# Patient Record
Sex: Male | Born: 2000 | Race: Black or African American | Hispanic: No | Marital: Single | Smoking: Never smoker
Health system: Southern US, Community
[De-identification: ages and names within clinical notes are randomized; demographics above are authoritative.]

---
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
## 2018-03-06 NOTE — Therapy (Signed)
[facility] [facility] [address] [place], [place], [address] Phone: [phone]   Fax:  [phone]  Patient Details  Name: Mario Ellis MRN: [id number] Date of Birth: Feb 01, 2001 Referring Provider:  Evon SlackGaines, Thomas C, PA-C  Encounter Date: 09/24/2017 Encounter opened in error, patient cancelled appt  [name], [name] 03/06/2018, 4:43 PM  [place] [facility] [address] [place], [place], [address] Phone: [phone]   Fax:  [phone]

## 2019-03-16 ENCOUNTER — Ambulatory Visit
Admission: EM | Admit: 2019-03-16 | Discharge: 2019-03-16 | Disposition: A | Discharge status: Home / Self Care | Payer: Medicaid Other | Attending: Family Medicine | Admitting: Family Medicine

## 2019-03-16 ENCOUNTER — Ambulatory Visit: Payer: Medicaid Other

## 2019-03-16 DIAGNOSIS — S60052A Contusion of left little finger without damage to nail, initial encounter: Secondary | ICD-10-CM | POA: Diagnosis present

## 2019-03-16 DIAGNOSIS — S63617A Unspecified sprain of left little finger, initial encounter: Secondary | ICD-10-CM | POA: Insufficient documentation

## 2019-03-16 DIAGNOSIS — W2209XA Striking against other stationary object, initial encounter: Secondary | ICD-10-CM

## 2019-03-16 NOTE — ED Triage Notes (Signed)
Pt hit his left pinky on the countertop today and did break that same finger 2 years ago. Now having pain and swelling, can move it slightly.

## 2019-03-16 NOTE — Discharge Instructions (Addendum)
Rest. Ice.  Wear finger splint today.  Then buddy taping for the next 3 days.  Ice.  Over-the-counter ibuprofen as needed.  Follow up with your primary care physician this week as needed. Return to Urgent care for new or worsening concerns.

## 2019-03-16 NOTE — ED Provider Notes (Signed)
[facility] ____________________________________________  Time seen: Approximately 4:11 PM  I have reviewed the triage vital signs and the nursing notes.   HISTORY  Chief Complaint Finger Injury   HPI Mario Ellis is a 18 y.o. male present with mother at bedside for evaluation of left fifth digit pain post injury that occurred today.  Patient reports he accidentally hit his finger directly on top of the counter causing it to bend backwards.  Reports pain since.  States he has a previous injury to that finger 2 years ago in which he broke it.  Reports pain and swelling with difficulty falling bending finger since.  No paresthesias.  Denies other pain or injuries.  Reports otherwise doing well.  No recent sickness.  No alleviating measures attempted.    History reviewed. No pertinent past medical history.  There are no active problems to display for this patient.   Past Surgical History:  Procedure Laterality Date  . CLOSED REDUCTION FINGER WITH PERCUTANEOUS PINNING Left 07/12/2017   Procedure: CLOSED REDUCTION PROXIMAL PHALANX PERCUTANEOUS PINNING-FIFTH LEFT;  Surgeon: Kennedy BuckerMenz, Michael, MD;  Location: [facility] ORS;  Service: Orthopedics;  Laterality: Left;     No current facility-administered medications for this encounter.   Current Outpatient Medications:  .  cyclobenzaprine (FLEXERIL) 5 MG tablet, Take 1 tablet (5 mg total) 3 (three) times daily as needed by mouth for muscle spasms., Disp: 30 tablet, Rfl: 0 .  DIFFERIN 0.1 % cream, Apply 1 application topically at bedtime., Disp: , Rfl: 2 .  HYDROcodone-acetaminophen (NORCO) 5-325 MG tablet, Take 1 tablet by mouth every 6 (six) hours as needed., Disp: 20 tablet, Rfl: 0  Allergies Patient has no known allergies.  Family History  Problem Relation Age of Onset  . Hypertension Mother   . Diabetes Father     Social History Social History   Tobacco Use  . Smoking status: Never Smoker  . Smokeless tobacco:  Never Used  Substance Use Topics  . Alcohol use: Never    Frequency: Never  . Drug use: Never    Review of Systems Constitutional: No fever Cardiovascular: Denies chest pain. Respiratory: Denies shortness of breath. Gastrointestinal: No abdominal pain.   Musculoskeletal: Positive left fifth digit pain.  ____________________________________________   PHYSICAL EXAM:  VITAL SIGNS: ED Triage Vitals  Enc Vitals Group     BP 03/16/19 1528 127/65     Pulse Rate 03/16/19 1528 82     Resp 03/16/19 1528 18     Temp 03/16/19 1528 98.4 F (36.9 C)     Temp Source 03/16/19 1528 Oral     SpO2 03/16/19 1528 99 %     Weight 03/16/19 1532 (!) 322 lb (146.1 kg)     Height 03/16/19 1532 6\' 7"  (2.007 m)     Head Circumference --      Peak Flow --      Pain Score 03/16/19 1532 7     Pain Loc --      Pain Edu? --      Excl. in GC? --     Constitutional: Alert and oriented. Well appearing and in no acute distress. ENT      Head: Normocephalic and atraumatic. Cardiovascular: Normal rate, regular rhythm. Grossly normal heart sounds.  Good peripheral circulation. Respiratory: Normal respiratory effort without tachypnea nor retractions. Breath sounds are clear and equal bilaterally. No wheezes, rales, rhonchi. Musculoskeletal: Steady.  Bilateral distal radial pulses equal and easily palpated.  Bilateral hand grip strong and equal. Except:  Left fifth digit at proximal middle phalanx and proximal phalanx moderate tenderness to palpation, minimal swelling, able to fully flex as well as extend but with some pain, normal distal sensation, left hand otherwise nontender. Neurologic:  Normal speech and language. No gross focal neurologic deficits are appreciated. Speech is normal. No gait instability.  Skin:  Skin is warm, dry and intact. No rash noted. Psychiatric: Mood and affect are normal. Speech and behavior are normal. Patient exhibits appropriate insight and judgment    ___________________________________________   LABS (all labs ordered are listed, but only abnormal results are displayed)  Labs Reviewed - No data to display  RADIOLOGY  Dg Finger Little Left  Result Date: 03/16/2019 CLINICAL DATA:  Injury to fifth digit. Previous fracture. EXAM: LEFT LITTLE FINGER 2+V COMPARISON:  Plain film dated 06/15/2017. FINDINGS: Osseous alignment is normal. No acute fracture line or displaced fracture fragment. Previous fracture at the base of the fifth proximal phalanx has healed. Adjacent soft tissues are unremarkable. IMPRESSION: Negative. Electronically Signed   By: Franki Cabot M.D.   On: 03/16/2019 15:48   ____________________________________________   PROCEDURES Procedures    INITIAL IMPRESSION / ASSESSMENT AND PLAN / ED COURSE  Pertinent labs & imaging results that were available during my care of the patient were reviewed by me and considered in my medical decision making (see chart for details).  Well-appearing patient.  Left fifth digit pain post mechanical injury.  Left fifth digit x-ray as above per radiologist, reviewed, negative.  Suspect sprain contusion injury.  Finger splint given to use today, then buddy taping for the next 3 days.  Over-the-counter ibuprofen, ice and supportive care.  Discussed follow up with Primary care physician this week. Discussed follow up and return parameters including no resolution or any worsening concerns. Patient verbalized understanding and agreed to plan.   ____________________________________________   FINAL CLINICAL IMPRESSION(S) / ED DIAGNOSES  Final diagnoses:  Sprain of left little finger, unspecified site of finger, initial encounter  Contusion of left little finger without damage to nail, initial encounter     ED Discharge Orders    None       Note: This dictation was prepared with Dragon dictation along with smaller phrase technology. Any transcriptional errors that result from this  process are unintentional.         Marylene Land, NP 03/16/19 1629

## 2020-01-29 DIAGNOSIS — Z419 Encounter for procedure for purposes other than remedying health state, unspecified: Secondary | ICD-10-CM | POA: Diagnosis not present

## 2020-02-29 DIAGNOSIS — Z419 Encounter for procedure for purposes other than remedying health state, unspecified: Secondary | ICD-10-CM | POA: Diagnosis not present

## 2020-03-31 DIAGNOSIS — Z419 Encounter for procedure for purposes other than remedying health state, unspecified: Secondary | ICD-10-CM | POA: Diagnosis not present

## 2020-04-30 DIAGNOSIS — Z419 Encounter for procedure for purposes other than remedying health state, unspecified: Secondary | ICD-10-CM | POA: Diagnosis not present

## 2020-05-31 DIAGNOSIS — Z419 Encounter for procedure for purposes other than remedying health state, unspecified: Secondary | ICD-10-CM | POA: Diagnosis not present

## 2020-06-30 DIAGNOSIS — Z419 Encounter for procedure for purposes other than remedying health state, unspecified: Secondary | ICD-10-CM | POA: Diagnosis not present

## 2020-07-31 DIAGNOSIS — Z419 Encounter for procedure for purposes other than remedying health state, unspecified: Secondary | ICD-10-CM | POA: Diagnosis not present

## 2020-08-31 DIAGNOSIS — Z419 Encounter for procedure for purposes other than remedying health state, unspecified: Secondary | ICD-10-CM | POA: Diagnosis not present

## 2020-09-28 DIAGNOSIS — Z419 Encounter for procedure for purposes other than remedying health state, unspecified: Secondary | ICD-10-CM | POA: Diagnosis not present

## 2020-10-02 ENCOUNTER — Other Ambulatory Visit: Payer: Self-pay

## 2020-10-02 ENCOUNTER — Encounter: Payer: Self-pay | Admitting: Emergency Medicine

## 2020-10-02 ENCOUNTER — Ambulatory Visit
Admission: EM | Admit: 2020-10-02 | Discharge: 2020-10-02 | Disposition: A | Discharge status: Home / Self Care | Payer: No Typology Code available for payment source | Attending: Sports Medicine | Admitting: Sports Medicine

## 2020-10-02 DIAGNOSIS — L600 Ingrowing nail: Secondary | ICD-10-CM | POA: Diagnosis not present

## 2020-10-02 DIAGNOSIS — M79675 Pain in left toe(s): Secondary | ICD-10-CM

## 2020-10-02 MED ORDER — CEPHALEXIN 500 MG PO CAPS: 500.0000 mg | ORAL_CAPSULE | Freq: Four times a day (QID) | ORAL | 0 refills | Status: DC

## 2020-10-02 NOTE — Discharge Instructions (Addendum)
Please take the antibiotics as prescribed. Please look at the educational handout and do not cut your toenails too closely. Gave you a work note you can return Monday, March 7. Just supportive care with the antibiotics and over-the-counter Tylenol or Motrin for any fever or discomfort. If your symptoms worsen in any way please come back here, see your primary care physician, or go to the ER.  I hope you get to feeling better, Dr. Zachery Dauer

## 2020-10-02 NOTE — ED Triage Notes (Signed)
Patient c/o redness, swelling and pain in his left big toe that started last night.

## 2020-10-02 NOTE — ED Provider Notes (Signed)
[facility]    CSN: [id number] Arrival date & time: 10/02/20  7124      History   Chief Complaint Chief Complaint  Patient presents with  . Toe Pain    HPI Mario Ellis is a 20 y.o. male.   Patient is a 20 year old male who presents for evaluation of the above issue.  He noticed that his left great toe was starting to hurt him.  He points to the medial aspect of the great toe near the nail bed.  It appears as though he cut his nails 2 days ago he may have cut it too closely.  He has got some redness mild swelling and pain at the medial cuticle.  No fever shakes chills.  No nausea vomiting diarrhea.  No red flag signs or symptoms elicited on history.  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     History reviewed. No pertinent past medical history.  There are no problems to display for this patient.   Past Surgical History:  Procedure Laterality Date  . CLOSED REDUCTION FINGER WITH PERCUTANEOUS PINNING Left 07/12/2017   Procedure: CLOSED REDUCTION PROXIMAL PHALANX PERCUTANEOUS PINNING-FIFTH LEFT;  Surgeon: Kennedy Bucker, MD;  Location: [facility] ORS;  Service: Orthopedics;  Laterality: Left;       Home Medications    Prior to Admission medications   Medication Sig Start Date End Date Taking? Authorizing Provider  cephALEXin (KEFLEX) 500 MG capsule Take 1 capsule (500 mg total) by mouth 4 (four) times daily. 10/02/20  Yes Delton See, MD  cyclobenzaprine (FLEXERIL) 5 MG tablet Take 1 tablet (5 mg total) 3 (three) times daily as needed by mouth for muscle spasms. 06/15/17   Nita Sickle, MD  DIFFERIN 0.1 % cream Apply 1 application topically at bedtime. 06/18/17   [provider]  HYDROcodone-acetaminophen (NORCO) 5-325 MG tablet Take 1 tablet by mouth every 6 (six) hours as needed. 07/12/17   Kennedy Bucker, MD    Family History Family History  Problem Relation Age of Onset  .  Hypertension Mother   . Diabetes Father     Social History Social History   Tobacco Use  . Smoking status: Never Smoker  . Smokeless tobacco: Never Used  Vaping Use  . Vaping Use: Never used  Substance Use Topics  . Alcohol use: Never  . Drug use: Never     Allergies   Patient has no known allergies.   Review of Systems Review of Systems  Constitutional: Negative.  Negative for activity change, appetite change, chills, diaphoresis, fatigue and fever.  HENT: Negative.   Eyes: Negative.   Respiratory: Negative.   Cardiovascular: Negative.   Gastrointestinal: Negative.   Genitourinary: Negative.   Musculoskeletal: Positive for arthralgias and joint swelling.  Skin: Positive for color change.  Neurological: Negative.  Negative for dizziness, syncope, light-headedness, numbness and headaches.  All other systems reviewed and are negative.    Physical Exam Triage Vital Signs ED Triage Vitals  Enc Vitals Group     BP 10/02/20 0854 (!) 136/92     Pulse Rate 10/02/20 0854 82     Resp 10/02/20 0854 16     Temp 10/02/20 0854 98.2 F (36.8 C)     Temp Source 10/02/20 0854 Oral     SpO2 10/02/20 0854 100 %     Weight 10/02/20 0851 (!) 322 lb (146.1 kg)     Height 10/02/20  6213 6\' 5"  (1.956 m)     Head Circumference --      Peak Flow --      Pain Score 10/02/20 0850 7     Pain Loc --      Pain Edu? --      Excl. in GC? --    No data found.  Updated Vital Signs BP (!) 136/92 (BP Location: Left Arm)   Pulse 82   Temp 98.2 F (36.8 C) (Oral)   Resp 16   Ht 6\' 5"  (1.956 m)   Wt (!) 146.1 kg   SpO2 100%   BMI 38.18 kg/m   Visual Acuity Right Eye Distance:   Left Eye Distance:   Bilateral Distance:    Right Eye Near:   Left Eye Near:    Bilateral Near:     Physical Exam Vitals and nursing note reviewed.  Constitutional:      General: He is not in acute distress.    Appearance: Normal appearance. He is not ill-appearing or toxic-appearing.  HENT:      Head: Normocephalic and atraumatic.  Cardiovascular:     Rate and Rhythm: Normal rate and regular rhythm.     Pulses: Normal pulses.     Heart sounds: Normal heart sounds. No murmur heard. No friction rub. No gallop.   Pulmonary:     Effort: Pulmonary effort is normal. No respiratory distress.     Breath sounds: Normal breath sounds. No stridor. No wheezing, rhonchi or rales.  Musculoskeletal:        General: Swelling and tenderness present. No deformity.     Comments: Right great toe: Normal to inspection palpation range of motion special test.  Left great toe: He has some erythema and some mild soft tissue swelling around the cuticle mostly medially.  He is tender to palpation.  Appears as though he has an early infection of that cuticle from cutting his toenails a little too closely.  Neurovascular normal sensation 2+ pulses distally.  Skin:    General: Skin is warm.     Capillary Refill: Capillary refill takes less than 2 seconds.     Coloration: Skin is not jaundiced.     Findings: Erythema present. No bruising, lesion or rash.  Neurological:     General: No focal deficit present.     Mental Status: He is alert and oriented to person, place, and time.      UC Treatments / Results  Labs (all labs ordered are listed, but only abnormal results are displayed) Labs Reviewed - No data to display  EKG   Radiology No results found.  Procedures Procedures (including critical care time)  Medications Ordered in UC Medications - No data to display  Initial Impression / Assessment and Plan / UC Course  I have reviewed the triage vital signs and the nursing notes.  Pertinent labs & imaging results that were available during my care of the patient were reviewed by me and considered in my medical decision making (see chart for details).  Clinical impression: Left great toe pain for 2 days.  Appears as though he cut his toenail a little too closely.  He has an early infection in  that area concentrated along the medial cuticle causing some redness and swelling in the great toe.  No history of gout and there is no joint involvement.  Treatment plan: 1.  The findings and treatment plan were discussed in detail with the patient.  Patient was in agreement. 2.  We will put him on Keflex for 7 days. 3.  Educational handouts were provided.  Warm compresses and he can soak that great toe and some warm water. 4.  Over-the-counter meds as needed for any fever or discomfort. 5.  Instructed him if things were to worsen in any way he should come back here, go to his primary care physician, or go to the ER.  He voiced verbal understanding. 6.  Gave him a work note keep him out of work tomorrow but he go back on Monday. 7.  Follow-up here as needed.    Final Clinical Impressions(s) / UC Diagnoses   Final diagnoses:  Great toe pain, left  Ingrown toenail of left foot with infection     Discharge Instructions     Please take the antibiotics as prescribed. Please look at the educational handout and do not cut your toenails too closely. Gave you a work note you can return Monday, March 7. Just supportive care with the antibiotics and over-the-counter Tylenol or Motrin for any fever or discomfort. If your symptoms worsen in any way please come back here, see your primary care physician, or go to the ER.  I hope you get to feeling better, Dr. Zachery Dauer    ED Prescriptions    Medication Sig Dispense Auth. Provider   cephALEXin (KEFLEX) 500 MG capsule Take 1 capsule (500 mg total) by mouth 4 (four) times daily. 28 capsule Delton See, MD     PDMP not reviewed this encounter.   Delton See, MD 10/02/20 [phone]

## 2020-10-14 ENCOUNTER — Other Ambulatory Visit: Payer: Self-pay

## 2020-10-14 ENCOUNTER — Ambulatory Visit (INDEPENDENT_AMBULATORY_CARE_PROVIDER_SITE_OTHER)
Admit: 2020-10-14 | Discharge: 2020-10-14 | Disposition: A | Discharge status: Home / Self Care | Payer: Medicaid Other | Attending: Family Medicine | Admitting: Family Medicine

## 2020-10-14 ENCOUNTER — Ambulatory Visit
Admission: EM | Admit: 2020-10-14 | Discharge: 2020-10-14 | Disposition: A | Discharge status: Home / Self Care | Payer: Medicaid Other | Attending: Family Medicine | Admitting: Family Medicine

## 2020-10-14 DIAGNOSIS — R1030 Lower abdominal pain, unspecified: Secondary | ICD-10-CM

## 2020-10-14 DIAGNOSIS — R102 Pelvic and perineal pain: Secondary | ICD-10-CM | POA: Diagnosis not present

## 2020-10-14 DIAGNOSIS — R109 Unspecified abdominal pain: Secondary | ICD-10-CM | POA: Diagnosis not present

## 2020-10-14 DIAGNOSIS — K5900 Constipation, unspecified: Secondary | ICD-10-CM

## 2020-10-14 DIAGNOSIS — M5386 Other specified dorsopathies, lumbar region: Secondary | ICD-10-CM | POA: Diagnosis not present

## 2020-10-14 LAB — COMPREHENSIVE METABOLIC PANEL
ALT: 29 U/L (ref 0–44)
AST: 24 U/L (ref 15–41)
Albumin: 4.5 g/dL (ref 3.5–5.0)
Alkaline Phosphatase: 57 U/L (ref 38–126)
Anion gap: 12 (ref 5–15)
BUN: 9 mg/dL (ref 6–20)
CO2: 25 mmol/L (ref 22–32)
Calcium: 9.8 mg/dL (ref 8.9–10.3)
Chloride: 104 mmol/L (ref 98–111)
Creatinine, Ser: 0.87 mg/dL (ref 0.61–1.24)
GFR, Estimated: >60 mL/min (ref >60)
Glucose, Bld: 96 mg/dL (ref 70–99)
Potassium: 3.9 mmol/L (ref 3.5–5.1)
Sodium: 141 mmol/L (ref 135–145)
Total Bilirubin: 1.4 mg/dL — ABNORMAL HIGH (ref 0.3–1.2)
Total Protein: 8 g/dL (ref 6.5–8.1)

## 2020-10-14 LAB — URINALYSIS, COMPLETE (UACMP) WITH MICROSCOPIC
Bilirubin Urine: NEGATIVE
Glucose, UA: NEGATIVE mg/dL
Hgb urine dipstick: NEGATIVE
Ketones, ur: NEGATIVE mg/dL
Leukocytes,Ua: NEGATIVE
Nitrite: NEGATIVE
Protein, ur: NEGATIVE mg/dL
RBC / HPF: NONE SEEN RBC/hpf (ref 0–5)
Specific Gravity, Urine: >1.03 — ABNORMAL HIGH (ref 1.005–1.030)
pH: 5 (ref 5.0–8.0)

## 2020-10-14 LAB — CBC WITH DIFFERENTIAL/PLATELET
Abs Immature Granulocytes: 0.01 10*3/uL (ref 0.00–0.07)
Basophils Absolute: 0 10*3/uL (ref 0.0–0.1)
Basophils Relative: 0 %
Eosinophils Absolute: 0.4 10*3/uL (ref 0.0–0.5)
Eosinophils Relative: 7 %
HCT: 41.2 % (ref 39.0–52.0)
Hemoglobin: 13.5 g/dL (ref 13.0–17.0)
Immature Granulocytes: 0 %
Lymphocytes Relative: 44 %
Lymphs Abs: 2.2 10*3/uL (ref 0.7–4.0)
MCH: 28 pg (ref 26.0–34.0)
MCHC: 32.8 g/dL (ref 30.0–36.0)
MCV: 85.5 fL (ref 80.0–100.0)
Monocytes Absolute: 0.5 10*3/uL (ref 0.1–1.0)
Monocytes Relative: 9 %
Neutro Abs: 2 10*3/uL (ref 1.7–7.7)
Neutrophils Relative %: 40 %
Platelets: 256 10*3/uL (ref 150–400)
RBC: 4.82 MIL/uL (ref 4.22–5.81)
RDW: 12 % (ref 11.5–15.5)
WBC: 5.1 10*3/uL (ref 4.0–10.5)
nRBC: 0 % (ref 0.0–0.2)

## 2020-10-14 MED ORDER — POLYETHYLENE GLYCOL 3350 17 GM/SCOOP PO POWD
ORAL | 0 refills | Status: DC
Start: 2020-10-14 — End: 2021-07-12

## 2020-10-14 MED ORDER — KETOROLAC TROMETHAMINE 10 MG PO TABS: 10.0000 mg | ORAL_TABLET | Freq: Four times a day (QID) | ORAL | 0 refills | Status: DC | PRN

## 2020-10-14 NOTE — ED Triage Notes (Signed)
Pt c/o LRQ and pelvic pain for several weeks, pt reports pain is increasing. Pt works on his feet all day and this also makes the pain worse. Pt denies any urinary/bowel, f/n/v/d or other symptoms. Pt does report some testicular pain on the right side but that has improved.

## 2020-10-14 NOTE — Discharge Instructions (Signed)
Medications as prescribed. ° °Take care ° °Dr. Prabhnoor Ellenberger  °

## 2020-10-14 NOTE — ED Provider Notes (Signed)
[facility]    CSN: [id number] Arrival date & time: 10/14/20  1224      History   Chief Complaint Chief Complaint  Patient presents with  . Abdominal Pain    LRQ   HPI 20 year old male presents with the above complaint.  Patient reports a 3-week history of right flank pain, right lower quadrant pain.  Denies any fever.  Denies nausea, vomiting, diarrhea.  States that he had some initial testicular pain but this is resolved.  He states that his pain seems to be worsening.  It is now constant and is quite severe.  He currently rates his pain is 8/10 in severity.  He states that it is worse with activity.  It is very sharp.  Denies urinary symptoms.  No relieving factors.  No other complaints or concerns at this time.   Past Surgical History:  Procedure Laterality Date  . CLOSED REDUCTION FINGER WITH PERCUTANEOUS PINNING Left 07/12/2017   Procedure: CLOSED REDUCTION PROXIMAL PHALANX PERCUTANEOUS PINNING-FIFTH LEFT;  Surgeon: Kennedy Bucker, MD;  Location: [facility] ORS;  Service: Orthopedics;  Laterality: Left;       Home Medications    Prior to Admission medications   Medication Sig Start Date End Date Taking? Authorizing Provider  ketorolac (TORADOL) 10 MG tablet Take 1 tablet (10 mg total) by mouth every 6 (six) hours as needed for moderate pain or severe pain. 10/14/20  Yes Jahaziel Francois G, DO  polyethylene glycol powder (GLYCOLAX/MIRALAX) 17 GM/SCOOP powder 17 g once or twice daily for constipation. 10/14/20  Yes Sabrin Dunlevy G, DO  cephALEXin (KEFLEX) 500 MG capsule Take 1 capsule (500 mg total) by mouth 4 (four) times daily. 10/02/20   Delton See, MD  cyclobenzaprine (FLEXERIL) 5 MG tablet Take 1 tablet (5 mg total) 3 (three) times daily as needed by mouth for muscle spasms. 06/15/17   Nita Sickle, MD  DIFFERIN 0.1 % cream Apply 1 application topically at bedtime. 06/18/17   [provider]  HYDROcodone-acetaminophen (NORCO) 5-325 MG tablet Take 1  tablet by mouth every 6 (six) hours as needed. 07/12/17   Kennedy Bucker, MD    Family History Family History  Problem Relation Age of Onset  . Hypertension Mother   . Diabetes Father     Social History Social History   Tobacco Use  . Smoking status: Never Smoker  . Smokeless tobacco: Never Used  Vaping Use  . Vaping Use: Never used  Substance Use Topics  . Alcohol use: Never  . Drug use: Never     Allergies   Patient has no known allergies.   Review of Systems Review of Systems  Constitutional: Negative for fever.  Gastrointestinal: Positive for abdominal pain.  Genitourinary: Positive for flank pain.   Physical Exam Triage Vital Signs ED Triage Vitals  Enc Vitals Group     BP 10/14/20 1001 (!) 143/76     Pulse Rate 10/14/20 1001 89     Resp 10/14/20 1001 18     Temp 10/14/20 1001 98.4 F (36.9 C)     Temp Source 10/14/20 1001 Oral     SpO2 10/14/20 1001 98 %     Weight 10/14/20 0959 (!) 322 lb (146.1 kg)     Height 10/14/20 0959 6\' 5"  (1.956 m)     Head Circumference --      Peak Flow --      Pain Score 10/14/20 0959 8     Pain Loc --  Pain Edu? --      Excl. in GC? --    Updated Vital Signs BP (!) 143/76 (BP Location: Left Arm)   Pulse 89   Temp 98.4 F (36.9 C) (Oral)   Resp 18   Ht 6\' 5"  (1.956 m)   Wt (!) 146.1 kg   SpO2 98%   BMI 38.18 kg/m   Visual Acuity Right Eye Distance:   Left Eye Distance:   Bilateral Distance:    Right Eye Near:   Left Eye Near:    Bilateral Near:     Physical Exam Vitals and nursing note reviewed.  Constitutional:      General: He is not in acute distress.    Appearance: He is well-developed. He is not ill-appearing.  HENT:     Head: Normocephalic and atraumatic.  Eyes:     General:        Right eye: No discharge.        Left eye: No discharge.     Conjunctiva/sclera: Conjunctivae normal.  Cardiovascular:     Rate and Rhythm: Normal rate and regular rhythm.  Pulmonary:     Effort: Pulmonary  effort is normal.     Breath sounds: Normal breath sounds. No wheezing, rhonchi or rales.  Abdominal:     General: There is no distension.     Palpations: Abdomen is soft.     Comments: Mild RLQ tenderness to palpation.  Neurological:     Mental Status: He is alert.  Psychiatric:        Mood and Affect: Mood normal.        Behavior: Behavior normal.    UC Treatments / Results  Labs (all labs ordered are listed, but only abnormal results are displayed) Labs Reviewed  COMPREHENSIVE METABOLIC PANEL - Abnormal; Notable for the following components:      Result Value   Total Bilirubin 1.4 (*)    All other components within normal limits  URINALYSIS, COMPLETE (UACMP) WITH MICROSCOPIC - Abnormal; Notable for the following components:   Specific Gravity, Urine >1.030 (*)    Bacteria, UA FEW (*)    All other components within normal limits  CBC WITH DIFFERENTIAL/PLATELET    EKG   Radiology CT ABDOMEN PELVIS WO CONTRAST  Result Date: 10/14/2020 CLINICAL DATA:  Flank pain.  Pelvic pain for several weeks. EXAM: CT ABDOMEN AND PELVIS WITHOUT CONTRAST TECHNIQUE: Multidetector CT imaging of the abdomen and pelvis was performed following the standard protocol without IV contrast. COMPARISON:  None. FINDINGS: Lower chest: Clear lung bases. Normal heart size without pericardial or pleural effusion. Hepatobiliary: Prominent lateral segment left liver lobe. Normal gallbladder, without biliary ductal dilatation. Pancreas: Normal, without mass or ductal dilatation. Spleen: Normal in size, without focal abnormality. Adrenals/Urinary Tract: Normal adrenal glands. No renal calculi or hydronephrosis. No hydroureter or ureteric calculi. No bladder calculi. Stomach/Bowel: Normal stomach, without wall thickening. Colonic stool burden suggests constipation. Normal appendix, and terminal ileum. Normal small bowel. Vascular/Lymphatic: Normal caliber of the aorta and branch vessels. No abdominopelvic adenopathy.  Reproductive: Normal prostate. Other: No significant free fluid.  No free intraperitoneal air. Musculoskeletal: No acute osseous abnormality. Minimal convex left lumbar spine curvature. IMPRESSION: 1. No urinary tract calculi or hydronephrosis. 2. Possible constipation. Electronically Signed   By: 10/16/2020 M.D.   On: 10/14/2020 11:48    Procedures Procedures (including critical care time)  Medications Ordered in UC Medications - No data to display  Initial Impression / Assessment and Plan / UC  Course  I have reviewed the triage vital signs and the nursing notes.  Pertinent labs & imaging results that were available during my care of the patient were reviewed by me and considered in my medical decision making (see chart for details).    20 year old male presents with ongoing right flank pain and right lower quadrant pain.  Tender on exam.  Laboratory studies unremarkable.  No leukocytosis.  No hematuria or evidence of UTI.  CT scan was obtained given ongoing and severe pain.  CT did not reveal evidence of stone.  No acute process noted.  He is constipated.  Toradol as needed.  MiraLAX as directed.  Supportive care.  Final Clinical Impressions(s) / UC Diagnoses   Final diagnoses:  Constipation, unspecified constipation type  Flank pain     Discharge Instructions     Medications as prescribed.  Take care  Dr. Adriana Simas     ED Prescriptions    Medication Sig Dispense Auth. Provider   ketorolac (TORADOL) 10 MG tablet Take 1 tablet (10 mg total) by mouth every 6 (six) hours as needed for moderate pain or severe pain. 20 tablet Damonie Ellenwood G, DO   polyethylene glycol powder (GLYCOLAX/MIRALAX) 17 GM/SCOOP powder 17 g once or twice daily for constipation. 500 g Tommie Sams, DO     PDMP not reviewed this encounter.   Tommie Sams, [place] 10/14/20 1218

## 2020-10-26 DIAGNOSIS — K5900 Constipation, unspecified: Secondary | ICD-10-CM | POA: Diagnosis not present

## 2020-10-26 DIAGNOSIS — R1084 Generalized abdominal pain: Secondary | ICD-10-CM | POA: Diagnosis not present

## 2020-10-29 DIAGNOSIS — Z419 Encounter for procedure for purposes other than remedying health state, unspecified: Secondary | ICD-10-CM | POA: Diagnosis not present

## 2020-11-14 ENCOUNTER — Encounter: Payer: Self-pay | Admitting: Emergency Medicine

## 2020-11-14 ENCOUNTER — Ambulatory Visit
Admission: EM | Admit: 2020-11-14 | Discharge: 2020-11-14 | Disposition: A | Discharge status: Home / Self Care | Payer: Medicaid Other | Attending: Physician Assistant | Admitting: Physician Assistant

## 2020-11-14 ENCOUNTER — Other Ambulatory Visit: Payer: Self-pay

## 2020-11-14 DIAGNOSIS — L03031 Cellulitis of right toe: Secondary | ICD-10-CM

## 2020-11-14 MED ORDER — DOXYCYCLINE HYCLATE 100 MG PO CAPS: 100.0000 mg | ORAL_CAPSULE | Freq: Two times a day (BID) | ORAL | 0 refills | Status: AC

## 2020-11-14 MED ORDER — MUPIROCIN 2 % EX OINT: 1.0000 "application " | TOPICAL_OINTMENT | Freq: Three times a day (TID) | CUTANEOUS | 0 refills | Status: AC

## 2020-11-14 NOTE — ED Triage Notes (Signed)
Patient states that he woke up 2 days ago and his right big toe was swollen, painful and red.  Patient denies any injury or fall.

## 2020-11-14 NOTE — ED Provider Notes (Signed)
[facility]    CSN: [id number] Arrival date & time: 11/14/20  2229      History   Chief Complaint Chief Complaint  Patient presents with  . Toe Pain    HPI Mario Ellis is a 20 y.o. male presenting for pain of the right great toe.  Patient states it started hurting about 2 days ago.  He denies any sort of injury or trauma to the foot/toe.  He says that the area is red and there has been pustular drainage. He has tried to keep area clean, but not applied any topical ointments. Has not taken anything for pain relief. He says he took off work yesterday because it hurt too badly to walk. No other concerns.   HPI  History reviewed. No pertinent past medical history.  There are no problems to display for this patient.   Past Surgical History:  Procedure Laterality Date  . CLOSED REDUCTION FINGER WITH PERCUTANEOUS PINNING Left 07/12/2017   Procedure: CLOSED REDUCTION PROXIMAL PHALANX PERCUTANEOUS PINNING-FIFTH LEFT;  Surgeon: Kennedy Bucker, MD;  Location: [facility] ORS;  Service: Orthopedics;  Laterality: Left;       Home Medications    Prior to Admission medications   Medication Sig Start Date End Date Taking? Authorizing Provider  doxycycline (VIBRAMYCIN) 100 MG capsule Take 1 capsule (100 mg total) by mouth 2 (two) times daily for 7 days. 11/14/20 11/21/20 Yes Shirlee Latch, PA-C  mupirocin ointment (BACTROBAN) 2 % Apply 1 application topically 3 (three) times daily for 7 days. 11/14/20 11/21/20 Yes Shirlee Latch, PA-C  cyclobenzaprine (FLEXERIL) 5 MG tablet Take 1 tablet (5 mg total) 3 (three) times daily as needed by mouth for muscle spasms. 06/15/17   Nita Sickle, MD  DIFFERIN 0.1 % cream Apply 1 application topically at bedtime. 06/18/17   [provider]  HYDROcodone-acetaminophen (NORCO) 5-325 MG tablet Take 1 tablet by mouth every 6 (six) hours as needed. 07/12/17   Kennedy Bucker, MD  ketorolac (TORADOL) 10 MG tablet Take 1 tablet (10 mg  total) by mouth every 6 (six) hours as needed for moderate pain or severe pain. 10/14/20   Tommie Sams, DO  polyethylene glycol powder (GLYCOLAX/MIRALAX) 17 GM/SCOOP powder 17 g once or twice daily for constipation. 10/14/20   Tommie Sams, DO    Family History Family History  Problem Relation Age of Onset  . Hypertension Mother   . Diabetes Father     Social History Social History   Tobacco Use  . Smoking status: Never Smoker  . Smokeless tobacco: Never Used  Vaping Use  . Vaping Use: Never used  Substance Use Topics  . Alcohol use: Never  . Drug use: Never     Allergies   Patient has no known allergies.   Review of Systems Review of Systems  Constitutional: Negative for fever.  Musculoskeletal: Negative for arthralgias, gait problem and joint swelling.  Skin: Positive for color change. Negative for rash and wound.  Neurological: Negative for weakness.     Physical Exam Triage Vital Signs ED Triage Vitals  Enc Vitals Group     BP 11/14/20 0840 132/88     Pulse Rate 11/14/20 0840 78     Resp 11/14/20 0840 16     Temp 11/14/20 0840 98.4 F (36.9 C)     Temp Source 11/14/20 0840 Oral     SpO2 11/14/20 0840 98 %     Weight 11/14/20 0839 (!) 322 lb (146.1 kg)  Height 11/14/20 0839 6\' 5"  (1.956 m)     Head Circumference --      Peak Flow --      Pain Score 11/14/20 0838 7     Pain Loc --      Pain Edu? --      Excl. in GC? --    No data found.  Updated Vital Signs BP 132/88 (BP Location: Left Arm)   Pulse 78   Temp 98.4 F (36.9 C) (Oral)   Resp 16   Ht 6\' 5"  (1.956 m)   Wt (!) 322 lb (146.1 kg)   SpO2 98%   BMI 38.18 kg/m       Physical Exam Vitals and nursing note reviewed.  Constitutional:      General: He is not in acute distress.    Appearance: Normal appearance. He is well-developed. He is not ill-appearing.  HENT:     Head: Normocephalic and atraumatic.  Eyes:     General: No scleral icterus.    Conjunctiva/sclera: Conjunctivae  normal.  Cardiovascular:     Rate and Rhythm: Normal rate and regular rhythm.     Pulses: Normal pulses.  Pulmonary:     Effort: Pulmonary effort is normal. No respiratory distress.  Musculoskeletal:     Cervical back: Neck supple.  Skin:    General: Skin is warm and dry.     Comments: RIGHT GREAT TOE: There is mild/moderate swelling/erythema lateral nailfold with associated TTP. Small amount of pustular drainage. Good ROM of toe. Normal strength and sensation.  Neurological:     General: No focal deficit present.     Mental Status: He is alert. Mental status is at baseline.     Motor: No weakness.     Gait: Gait normal.  Psychiatric:        Mood and Affect: Mood normal.        Behavior: Behavior normal.        Thought Content: Thought content normal.      UC Treatments / Results  Labs (all labs ordered are listed, but only abnormal results are displayed) Labs Reviewed - No data to display  EKG   Radiology No results found.  Procedures Procedures (including critical care time)  Medications Ordered in UC Medications - No data to display  Initial Impression / Assessment and Plan / UC Course  I have reviewed the triage vital signs and the nursing notes.  Pertinent labs & imaging results that were available during my care of the patient were reviewed by me and considered in my medical decision making (see chart for details).   20 y/o male presenting with paronychia/cellulitis right great toe with active pustular drainage. Treating at this time with doxycycline, mupirocin and supportive care. Tylenol/Motrin for pain relief. F/u with or PCP if not improving over next week. ED precautions reviewed.   Final Clinical Impressions(s) / UC Diagnoses   Final diagnoses:  Paronychia, toe, right     Discharge Instructions     You have a paronychia, a type of soft tissue infection that does require an antibiotic so we will treat with antibiotics. Take NSAIDs or Tylenol for  pain as needed. Take doxycycline and use mupirocin ointment. Warm soaks 2 times daily. Lookout for spreading redness, fluctuance, fever, drainage from the area, or increased pain. Follow up in 2-3 days with PCP or our office sooner if condition is not improving or if especially worsening.     ED Prescriptions    Medication  Sig Dispense Auth. Provider   doxycycline (VIBRAMYCIN) 100 MG capsule Take 1 capsule (100 mg total) by mouth 2 (two) times daily for 7 days. 14 capsule Eusebio Friendly B, PA-C   mupirocin ointment (BACTROBAN) 2 % Apply 1 application topically 3 (three) times daily for 7 days. 22 g Shirlee Latch, PA-C     PDMP not reviewed this encounter.   Shirlee Latch, PA-C 11/14/20 [phone]

## 2020-11-14 NOTE — Discharge Instructions (Signed)
You have a paronychia, a type of soft tissue infection that does require an antibiotic so we will treat with antibiotics. Take NSAIDs or Tylenol for pain as needed. Take doxycycline and use mupirocin ointment. Warm soaks 2 times daily. Lookout for spreading redness, fluctuance, fever, drainage from the area, or increased pain. Follow up in 2-3 days with PCP or our office sooner if condition is not improving or if especially worsening.

## 2020-11-28 DIAGNOSIS — Z419 Encounter for procedure for purposes other than remedying health state, unspecified: Secondary | ICD-10-CM | POA: Diagnosis not present

## 2020-12-29 DIAGNOSIS — Z419 Encounter for procedure for purposes other than remedying health state, unspecified: Secondary | ICD-10-CM | POA: Diagnosis not present

## 2021-01-27 ENCOUNTER — Other Ambulatory Visit: Payer: Self-pay

## 2021-01-27 ENCOUNTER — Ambulatory Visit
Admission: EM | Admit: 2021-01-27 | Discharge: 2021-01-27 | Disposition: A | Discharge status: Home / Self Care | Payer: Medicaid Other | Attending: Sports Medicine | Admitting: Sports Medicine

## 2021-01-27 ENCOUNTER — Encounter: Payer: Self-pay | Admitting: Emergency Medicine

## 2021-01-27 DIAGNOSIS — S8001XA Contusion of right knee, initial encounter: Secondary | ICD-10-CM | POA: Diagnosis not present

## 2021-01-27 DIAGNOSIS — S5012XA Contusion of left forearm, initial encounter: Secondary | ICD-10-CM

## 2021-01-27 DIAGNOSIS — M25561 Pain in right knee: Secondary | ICD-10-CM

## 2021-01-27 DIAGNOSIS — S301XXA Contusion of abdominal wall, initial encounter: Secondary | ICD-10-CM
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
## 2021-01-28 DIAGNOSIS — Z419 Encounter for procedure for purposes other than remedying health state, unspecified: Secondary | ICD-10-CM | POA: Diagnosis not present

## 2021-02-02 ENCOUNTER — Other Ambulatory Visit: Payer: Self-pay | Admitting: Pediatrics

## 2021-02-02 ENCOUNTER — Ambulatory Visit: Admission: RE | Admit: 2021-02-02 | Payer: Medicaid Other | Source: Home / Self Care

## 2021-02-02 DIAGNOSIS — R1031 Right lower quadrant pain: Secondary | ICD-10-CM | POA: Diagnosis not present

## 2021-02-02 DIAGNOSIS — S301XXA Contusion of abdominal wall, initial encounter: Secondary | ICD-10-CM

## 2021-02-03 ENCOUNTER — Other Ambulatory Visit: Payer: Self-pay

## 2021-02-03 ENCOUNTER — Ambulatory Visit
Admission: RE | Admit: 2021-02-03 | Discharge: 2021-02-03 | Disposition: A | Discharge status: Home / Self Care | Payer: Medicaid Other | Source: Ambulatory Visit | Attending: Pediatrics | Admitting: Pediatrics

## 2021-02-03 DIAGNOSIS — S301XXA Contusion of abdominal wall, initial encounter: Secondary | ICD-10-CM | POA: Insufficient documentation

## 2021-02-18 ENCOUNTER — Ambulatory Visit: Payer: Medicaid Other | Admitting: Family Medicine

## 2021-02-28 DIAGNOSIS — Z419 Encounter for procedure for purposes other than remedying health state, unspecified: Secondary | ICD-10-CM | POA: Diagnosis not present

## 2021-03-31 DIAGNOSIS — Z419 Encounter for procedure for purposes other than remedying health state, unspecified: Secondary | ICD-10-CM | POA: Diagnosis not present

## 2021-04-30 DIAGNOSIS — Z419 Encounter for procedure for purposes other than remedying health state, unspecified: Secondary | ICD-10-CM | POA: Diagnosis not present

## 2021-05-31 DIAGNOSIS — Z419 Encounter for procedure for purposes other than remedying health state, unspecified: Secondary | ICD-10-CM | POA: Diagnosis not present

## 2021-06-30 DIAGNOSIS — Z419 Encounter for procedure for purposes other than remedying health state, unspecified: Secondary | ICD-10-CM | POA: Diagnosis not present

## 2021-07-12 ENCOUNTER — Other Ambulatory Visit: Payer: Self-pay

## 2021-07-12 ENCOUNTER — Ambulatory Visit
Admission: EM | Admit: 2021-07-12 | Discharge: 2021-07-12 | Disposition: A | Discharge status: Home / Self Care | Payer: Medicaid Other | Attending: Family | Admitting: Family

## 2021-07-12 DIAGNOSIS — R52 Pain, unspecified: Secondary | ICD-10-CM

## 2021-07-12 DIAGNOSIS — U071 COVID-19: Secondary | ICD-10-CM | POA: Insufficient documentation

## 2021-07-12 DIAGNOSIS — R051 Acute cough: Secondary | ICD-10-CM | POA: Insufficient documentation

## 2021-07-12 NOTE — Discharge Instructions (Addendum)
Recommend continue OTC Mucinex and Nyquil as needed for cough. Continue to push fluids to help loosen up mucus in chest. Rest. Follow-up pending COVID 19 test results.

## 2021-07-12 NOTE — ED Provider Notes (Signed)
[facility]    CSN: [id number] Arrival date & time: 07/12/21  1751      History   Chief Complaint Chief Complaint  Patient presents with   Cough    HPI Mario Ellis is a 20 y.o. male.   20 year old male presents sinus pressure, cough and congestion that has been occurring for over 1 week. Started with low grade fever, sore throat and body aches. Also experiencing some diarrhea. Cough and congestion has continued but improved. Denies any vomiting or headache now. Fever and sore throat are gone. Has taken OTC Mucinex, Cold and Flu and Nyquil with some success. Requests testing for COVID. His Dad is concerned about pneumonia. No other chronic health issues. Takes no daily medication.   The history is provided by the patient.   History reviewed. No pertinent past medical history.  There are no problems to display for this patient.   Past Surgical History:  Procedure Laterality Date   CLOSED REDUCTION FINGER WITH PERCUTANEOUS PINNING Left 07/12/2017   Procedure: CLOSED REDUCTION PROXIMAL PHALANX PERCUTANEOUS PINNING-FIFTH LEFT;  Surgeon: Kennedy Bucker, MD;  Location: [facility] ORS;  Service: Orthopedics;  Laterality: Left;       Home Medications    Prior to Admission medications   Not on File    Family History Family History  Problem Relation Age of Onset   Hypertension Mother    Diabetes Father     Social History Social History   Tobacco Use   Smoking status: Never   Smokeless tobacco: Never  Vaping Use   Vaping Use: Never used  Substance Use Topics   Alcohol use: Never   Drug use: Never     Allergies   Patient has no known allergies.   Review of Systems Review of Systems  Constitutional:  Positive for appetite change, fatigue and fever (at beginning of illness- none currently). Negative for chills.  HENT:  Positive for congestion, postnasal drip, sinus pressure and sore throat (at first but has resolved). Negative for ear discharge,  ear pain, facial swelling, nosebleeds, sinus pain and trouble swallowing.   Eyes:  Negative for pain, discharge, redness and itching.  Respiratory:  Positive for cough and chest tightness. Negative for shortness of breath and wheezing.   Gastrointestinal:  Positive for diarrhea. Negative for nausea and vomiting.  Musculoskeletal:  Positive for arthralgias and myalgias. Negative for neck pain and neck stiffness.  Skin:  Negative for color change and rash.  Allergic/Immunologic: Negative for environmental allergies, food allergies and immunocompromised state.  Neurological:  Positive for headaches. Negative for dizziness, tremors, seizures, syncope and light-headedness.  Hematological:  Negative for adenopathy. Does not bruise/bleed easily.    Physical Exam Triage Vital Signs ED Triage Vitals  Enc Vitals Group     BP 07/12/21 1913 127/86     Pulse Rate 07/12/21 1913 70     Resp 07/12/21 1913 18     Temp 07/12/21 1913 99 F (37.2 C)     Temp Source 07/12/21 1913 Oral     SpO2 07/12/21 1913 99 %     Weight 07/12/21 1912 (!) 320 lb (145.2 kg)     Height 07/12/21 1912 6\' 8"  (2.032 m)     Head Circumference --      Peak Flow --      Pain Score 07/12/21 1912 0     Pain Loc --      Pain Edu? --      Excl. in GC? --  No data found.  Updated Vital Signs BP 127/86 (BP Location: Left Arm)    Pulse 70    Temp 99 F (37.2 C) (Oral)    Resp 18    Ht 6\' 8"  (2.032 m)    Wt (!) 320 lb (145.2 kg)    SpO2 99%    BMI 35.15 kg/m   Visual Acuity Right Eye Distance:   Left Eye Distance:   Bilateral Distance:    Right Eye Near:   Left Eye Near:    Bilateral Near:     Physical Exam Vitals and nursing note reviewed.  Constitutional:      General: He is awake. He is not in acute distress.    Appearance: He is well-developed and well-groomed.     Comments: He is sitting comfortably on the exam table in no acute distress.   HENT:     Head: Normocephalic and atraumatic.     Right Ear:  Hearing, tympanic membrane, ear canal and external ear normal.     Left Ear: Hearing, tympanic membrane, ear canal and external ear normal.     Nose: Nose normal. No congestion or rhinorrhea.     Right Sinus: No maxillary sinus tenderness or frontal sinus tenderness.     Left Sinus: No maxillary sinus tenderness or frontal sinus tenderness.     Mouth/Throat:     Lips: Pink.     Mouth: Mucous membranes are moist.     Pharynx: Uvula midline. Posterior oropharyngeal erythema present. No pharyngeal swelling, oropharyngeal exudate or uvula swelling.  Eyes:     Extraocular Movements: Extraocular movements intact.     Conjunctiva/sclera: Conjunctivae normal.  Cardiovascular:     Rate and Rhythm: Normal rate and regular rhythm.     Heart sounds: Normal heart sounds. No murmur heard. Pulmonary:     Effort: Pulmonary effort is normal. No respiratory distress.     Breath sounds: Normal air entry. No decreased air movement. Examination of the right-upper field reveals rhonchi. Examination of the left-upper field reveals rhonchi. Rhonchi present. No decreased breath sounds, wheezing or rales.     Comments: Coarse breath sounds when coughing but otherwise clear Musculoskeletal:     Cervical back: Normal range of motion and neck supple.  Lymphadenopathy:     Cervical: No cervical adenopathy.  Skin:    General: Skin is warm and dry.     Capillary Refill: Capillary refill takes less than 2 seconds.     Findings: No rash.  Neurological:     General: No focal deficit present.     Mental Status: He is alert and oriented to person, place, and time.  Psychiatric:        Mood and Affect: Mood normal.        Behavior: Behavior normal. Behavior is cooperative.        Thought Content: Thought content normal.        Judgment: Judgment normal.     UC Treatments / Results  Labs (all labs ordered are listed, but only abnormal results are displayed) Labs Reviewed  SARS CORONAVIRUS 2 (TAT 6-24 HRS)     EKG   Radiology No results found.  Procedures Procedures (including critical care time)  Medications Ordered in UC Medications - No data to display  Initial Impression / Assessment and Plan / UC Course  I have reviewed the triage vital signs and the nursing notes.  Pertinent labs & imaging results that were available during my care of the patient were  reviewed by me and considered in my medical decision making (see chart for details).     Reviewed with patient that he appears to have a viral upper respiratory illness- possibly Influenza but will test for COVID 19. Discussed that since he has had symptoms for over 1 week and they are improving and cough is slowly getting better, doubt pneumonia and symptoms should continue to slowly resolve. Symptomatic treatment at this time. Recommend continue OTC Mucinex and Nyquil as needed for cough. Continue to push fluids to help loosen up mucus in chest. Rest. Follow-up pending COVID 19 test results.    Final Clinical Impressions(s) / UC Diagnoses   Final diagnoses:  Acute cough  Generalized body aches     Discharge Instructions      Recommend continue OTC Mucinex and Nyquil as needed for cough. Continue to push fluids to help loosen up mucus in chest. Rest. Follow-up pending COVID 19 test results.     ED Prescriptions   None    PDMP not reviewed this encounter.   Sudie Grumbling, NP 07/13/21 [phone]

## 2021-07-12 NOTE — ED Triage Notes (Signed)
Pt here with C/O sore throat, body aches, cough for 1 week. Denies fever or known exposure.

## 2021-07-13 LAB — SARS CORONAVIRUS 2 (TAT 6-24 HRS): SARS Coronavirus 2: POSITIVE — AB

## 2021-07-24 IMAGING — CT CT ABD-PELV W/O CM
1 of 2 series · 15 of 32 positions shown, 19 images · non-contrast
Comparison: None.

CLINICAL DATA: Flank pain.  Pelvic pain for several weeks.

EXAM:
CT ABDOMEN AND PELVIS WITHOUT CONTRAST
TECHNIQUE: Multidetector CT imaging of the abdomen and pelvis was performed
following the standard protocol without IV contrast.

[Series 2: axial st · axial · 0.78mm/px · z∈[-1109,-544]mm · 15 of 125 slices shown, 19 images]
[im 6/125  soft-tissue]
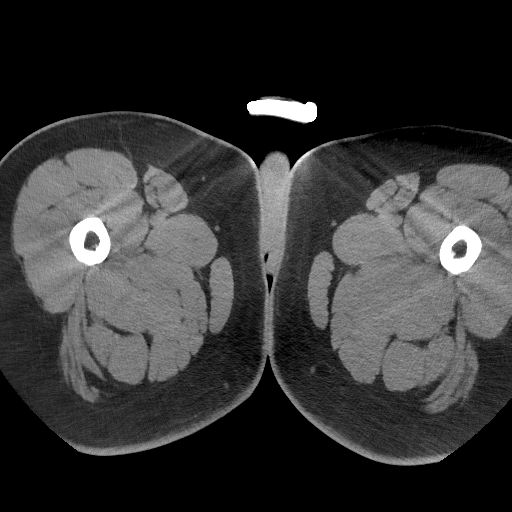
[im 6/125  bone]
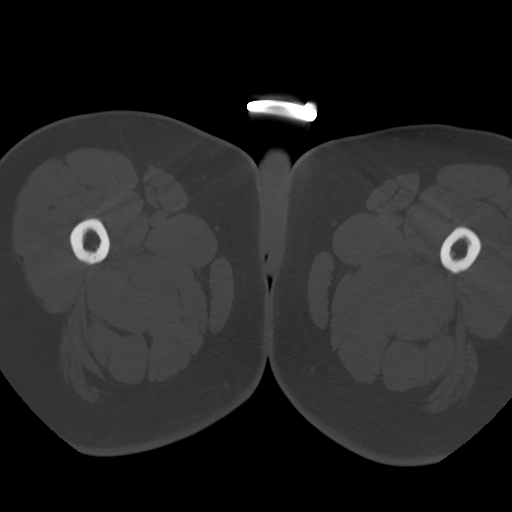
[im 17/125  soft-tissue]
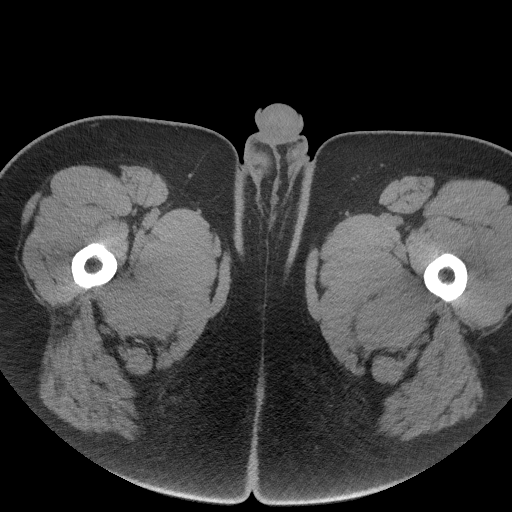
[im 27/125  soft-tissue]
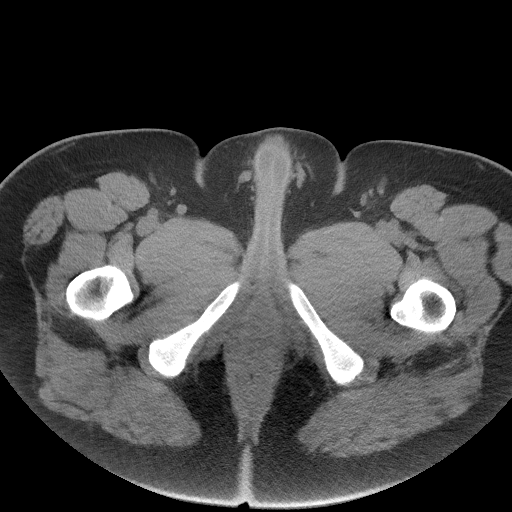
[im 33/125  soft-tissue]
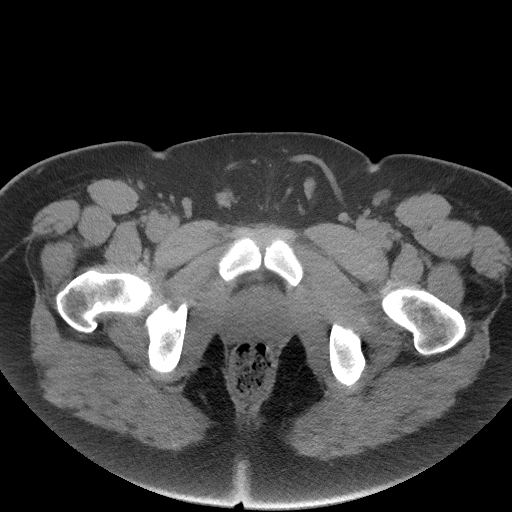
[im 44/125  soft-tissue]
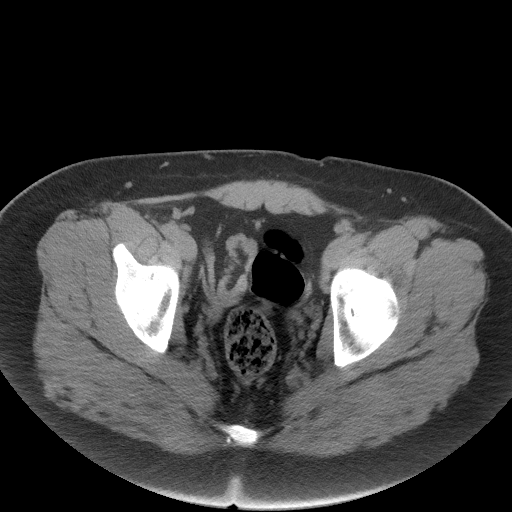
[im 54/125  soft-tissue]
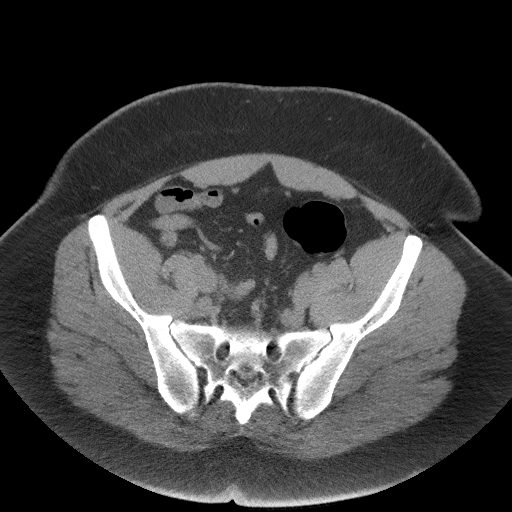
[im 65/125  soft-tissue]
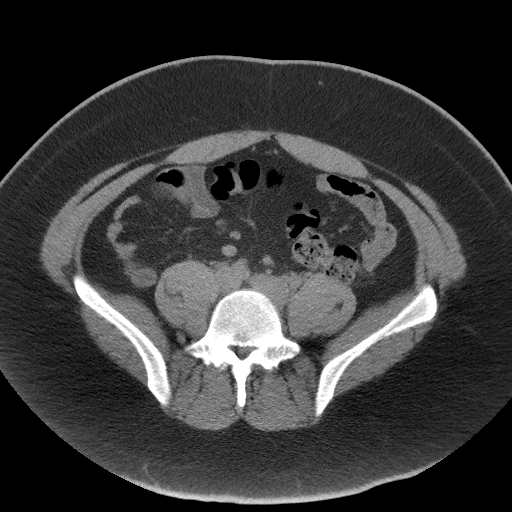
[im 71/125  soft-tissue]
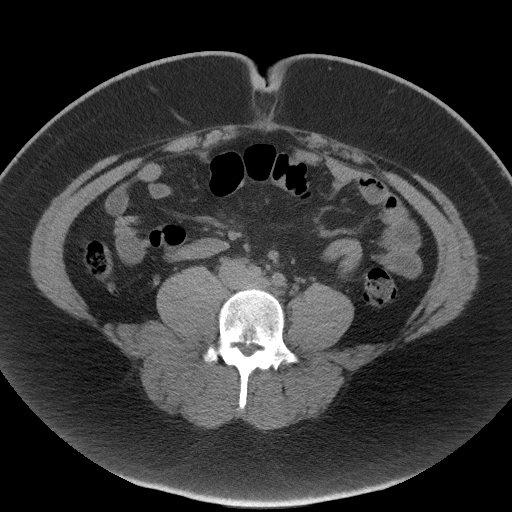
[im 81/125  soft-tissue]
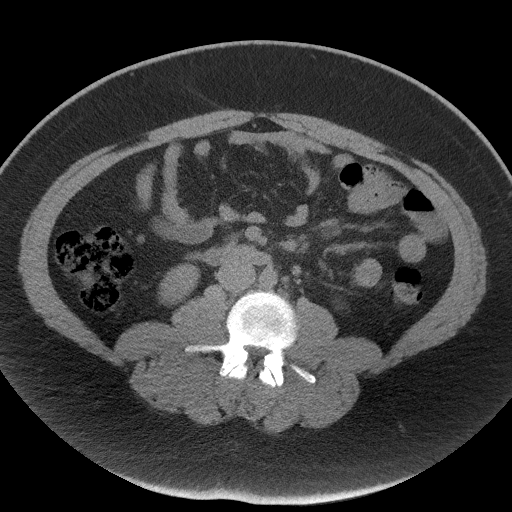
[im 81/125  bone]
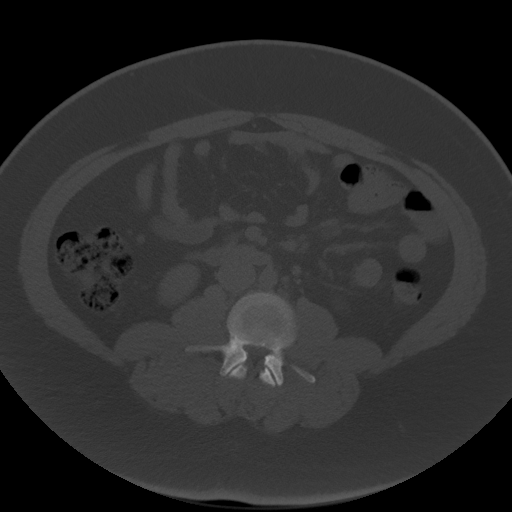
[im 92/125  soft-tissue]
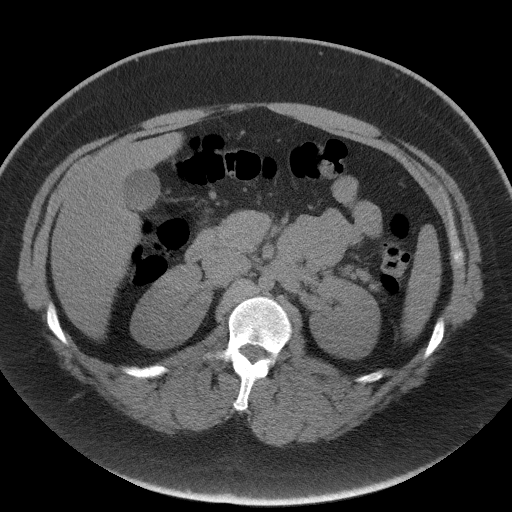
[im 98/125  soft-tissue]
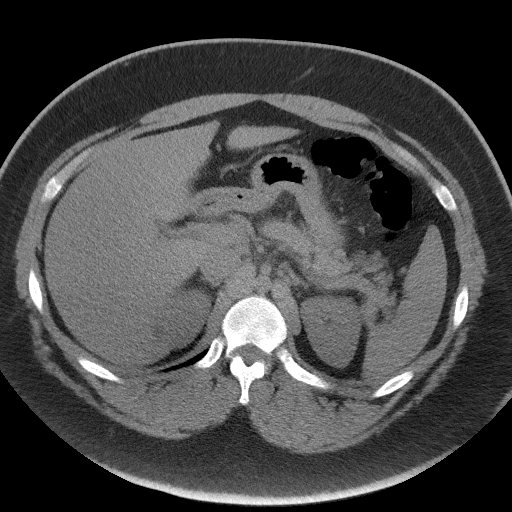
[im 103/125  lung]
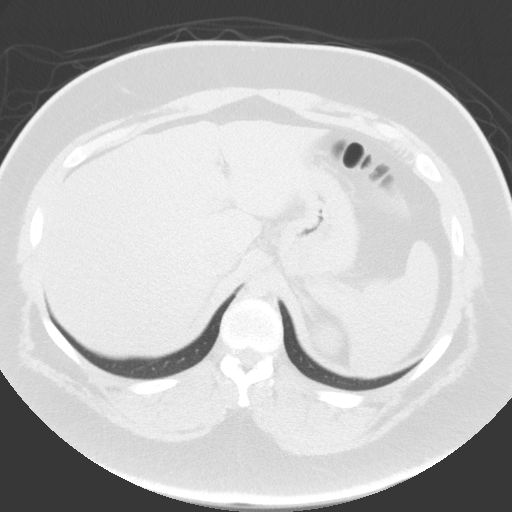
[im 108/125  soft-tissue]
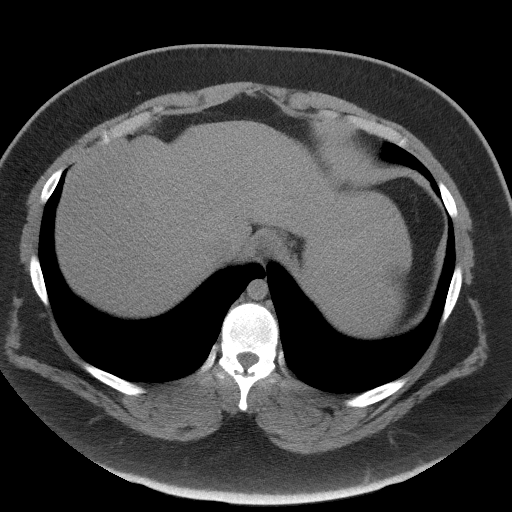
[im 108/125  lung]
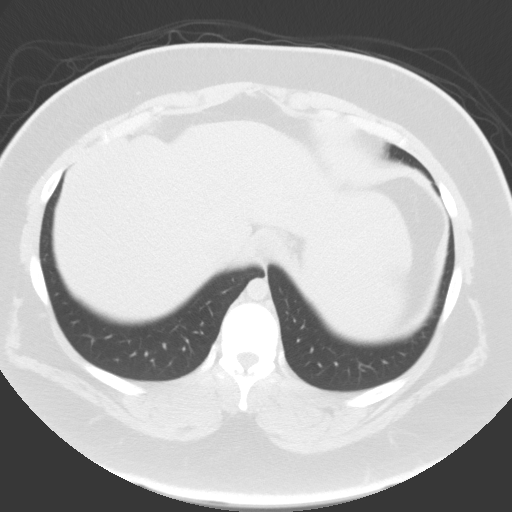
[im 114/125  lung]
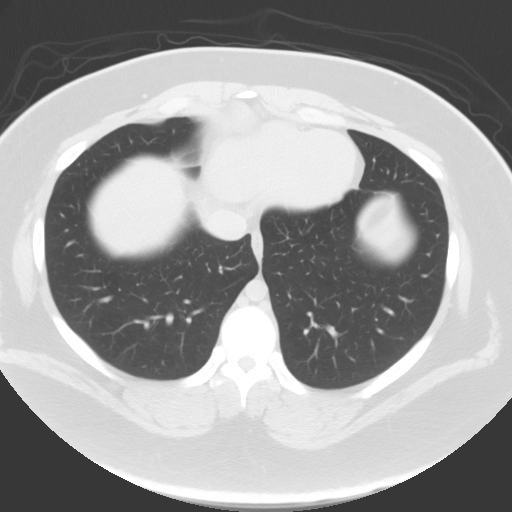
[im 119/125  soft-tissue]
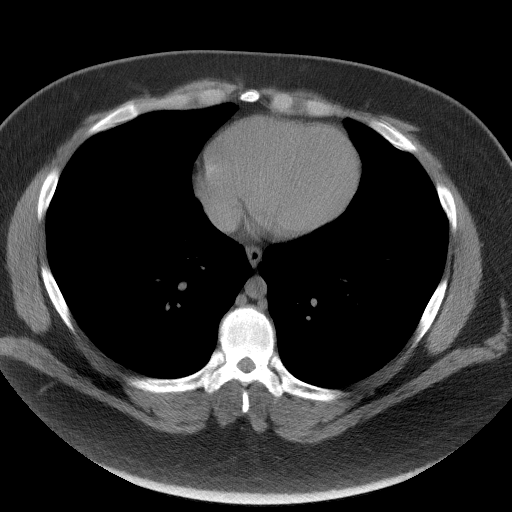
[im 119/125  lung]
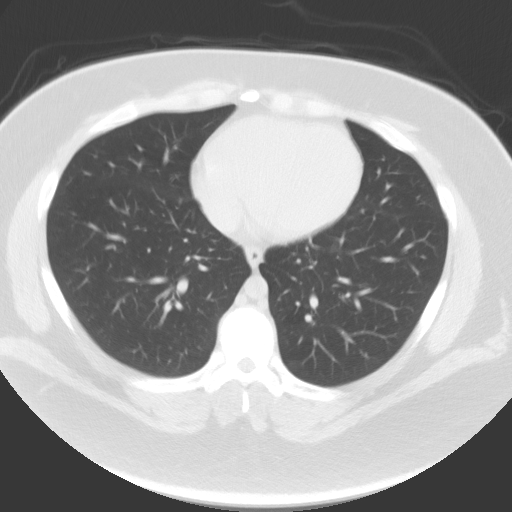

[15 of 32 positions shown; findings below may reference images not displayed]

FINDINGS: Lower chest: Clear lung bases. Normal heart size without pericardial
or pleural effusion.

Hepatobiliary: Prominent lateral segment left liver lobe. Normal
gallbladder, without biliary ductal dilatation.

Pancreas: Normal, without mass or ductal dilatation.

Spleen: Normal in size, without focal abnormality.

Adrenals/Urinary Tract: Normal adrenal glands. No renal calculi or
hydronephrosis. No hydroureter or ureteric calculi. No bladder
calculi.

Stomach/Bowel: Normal stomach, without wall thickening. Colonic
stool burden suggests constipation. Normal appendix, and terminal
ileum. Normal small bowel.

Vascular/Lymphatic: Normal caliber of the aorta and branch vessels.
No abdominopelvic adenopathy.

Reproductive: Normal prostate.

Other: No significant free fluid.  No free intraperitoneal air.

Musculoskeletal: No acute osseous abnormality. Minimal convex left
lumbar spine curvature.
IMPRESSION: 1. No urinary tract calculi or hydronephrosis.
2. Possible constipation.

## 2021-07-31 DIAGNOSIS — Z419 Encounter for procedure for purposes other than remedying health state, unspecified: Secondary | ICD-10-CM | POA: Diagnosis not present

## 2021-08-31 DIAGNOSIS — Z419 Encounter for procedure for purposes other than remedying health state, unspecified: Secondary | ICD-10-CM | POA: Diagnosis not present

## 2021-09-28 DIAGNOSIS — Z419 Encounter for procedure for purposes other than remedying health state, unspecified: Secondary | ICD-10-CM | POA: Diagnosis not present

## 2021-10-29 DIAGNOSIS — Z419 Encounter for procedure for purposes other than remedying health state, unspecified: Secondary | ICD-10-CM | POA: Diagnosis not present

## 2021-11-28 DIAGNOSIS — Z419 Encounter for procedure for purposes other than remedying health state, unspecified: Secondary | ICD-10-CM | POA: Diagnosis not present

## 2021-12-14 ENCOUNTER — Ambulatory Visit
Admission: EM | Admit: 2021-12-14 | Discharge: 2021-12-14 | Disposition: A | Discharge status: Home / Self Care | Payer: Medicaid Other | Attending: Emergency Medicine | Admitting: Emergency Medicine

## 2021-12-14 DIAGNOSIS — H109 Unspecified conjunctivitis: Secondary | ICD-10-CM | POA: Diagnosis not present

## 2021-12-14 DIAGNOSIS — J029 Acute pharyngitis, unspecified: Secondary | ICD-10-CM | POA: Insufficient documentation

## 2021-12-14 DIAGNOSIS — J069 Acute upper respiratory infection, unspecified: Secondary | ICD-10-CM | POA: Diagnosis not present

## 2021-12-14 LAB — GROUP A STREP BY PCR: Group A Strep by PCR: NOT DETECTED

## 2021-12-14 MED ORDER — LIDOCAINE VISCOUS HCL 2 % MT SOLN: 15.0000 mL | OROMUCOSAL | 0 refills | Status: DC | PRN

## 2021-12-14 MED ORDER — POLYMYXIN B-TRIMETHOPRIM 10000-0.1 UNIT/ML-% OP SOLN
1.0000 [drp] | OPHTHALMIC | 0 refills | Status: DC
Start: 2021-12-14 — End: 2022-10-02

## 2021-12-14 MED ORDER — IBUPROFEN 800 MG PO TABS: 800.0000 mg | ORAL_TABLET | Freq: Three times a day (TID) | ORAL | 0 refills | Status: DC

## 2021-12-14 NOTE — ED Triage Notes (Signed)
Patient is here for "about a week ago, or 6 days, at work and started with sorethroat, then fever up to 102.3, chills, nausea at that time". Then "cough" & about "2 days ago, right eye turned red, watery, itchy".  No fever since initial.  ?

## 2021-12-14 NOTE — Discharge Instructions (Addendum)
For manageYour symptoms today are most likely being caused by a virus and should steadily improve in time it can take up to 7 to 10 days before you truly start to see a turnaround however things will get better ? ?Strep test negative ? ?You may gargle and spit lidocaine solution every 4 hours as needed for temporary relief to your sore throat ? ?You may use 800 mg of ibuprofen every 8 hours in addition to over-the-counter Tylenol ? ?You will be treated for bacterial conjunctivitis, Today you being treated for bacterial conjunctivitis.  ? ?Place one drop of polytrim into the effected eye every 4 hours while awake for 7 days. If the other eye starts to have symptoms you may use medication in it as well. Do not allow tip of dropper to touch eye. ? ?May use cool compress for comfort and to remove discharge if present. Pat the eye, do not wipe. ? ?Do not rub eyes, this may cause more irritation. ? ?May use, Claritin or zyrtec  benadryl as needed to help if itching present. for discomfort ?  ?For cough: honey 1/2 to 1 teaspoon (you can dilute the honey in water or another fluid).  You can also use guaifenesin and dextromethorphan for cough. You can use a humidifier for chest congestion and cough.  If you don't have a humidifier, you can sit in the bathroom with the hot shower running.    ?  ?For sore throat: try warm salt water gargles, cepacol lozenges, throat spray, warm tea or water with lemon/honey, popsicles or ice, or OTC cold relief medicine for throat discomfort. ?  ?For congestion: take a daily anti-histamine like Zyrtec, Claritin, and a oral decongestant, such as pseudoephedrine.  You can also use Flonase 1-2 sprays in each nostril daily. ?  ?It is important to stay hydrated: drink plenty of fluids (water, gatorade/powerade/pedialyte, juices, or teas) to keep your throat moisturized and help further relieve irritation/discomfort. ? ?

## 2021-12-14 NOTE — ED Provider Notes (Signed)
?[facility] ? ? ? ?CSN: [id number] ?Arrival date & time: 12/14/21  1347 ? ? ?  ? ?History   ?Chief Complaint ?Chief Complaint  ?Patient presents with  ? Sore Throat  ? Fever  ? Eye Problem  ? ? ?HPI ?KORBY RATAY is a 21 y.o. male.  ? ?Patient presents with fever, nasal congestion, rhinorrhea, sore throat, nonproductive cough, shortness of breath with exertion for 6 days.  Endorses right eye pain, erythema, drainage with crusting and itching for the last 2 to 3 days.  Eye pain has resolved.  Decreased appetite but tolerating some food and fluids.  No known sick contacts.  Has attempted use of TheraFlu, Mucinex which has been minimally helpful.  History of seasonal allergies, taking Zyrtec daily.   ? ?History reviewed. No pertinent past medical history. ? ?There are no problems to display for this patient. ? ? ?Past Surgical History:  ?Procedure Laterality Date  ? CLOSED REDUCTION FINGER WITH PERCUTANEOUS PINNING Left 07/12/2017  ? Procedure: CLOSED REDUCTION PROXIMAL PHALANX PERCUTANEOUS PINNING-FIFTH LEFT;  Surgeon: Kennedy Bucker, MD;  Location: [facility] ORS;  Service: Orthopedics;  Laterality: Left;  ? ? ? ? ? ?Home Medications   ? ?Prior to Admission medications   ?Medication Sig Start Date End Date Taking? Authorizing Provider  ?cyclobenzaprine (FLEXERIL) 5 MG tablet Take by mouth. 06/15/17  Yes [provider]  ?diphenhydrAMINE HCl (THERAFLU MULTI SYMPTOM PO) Take by mouth.   Yes [provider]  ?HYDROcodone-acetaminophen (NORCO/VICODIN) 5-325 MG tablet Take by mouth. 07/12/17  Yes [provider]  ?ibuprofen (ADVIL) 800 MG tablet Take by mouth. 06/18/17  Yes [provider]  ? ? ?Family History ?Family History  ?Problem Relation Age of Onset  ? Hypertension Mother   ? Diabetes Father   ? ? ?Social History ?Social History  ? ?Tobacco Use  ? Smoking status: Never  ? Smokeless tobacco: Never  ?Vaping Use  ? Vaping Use: Never used  ?Substance Use Topics  ? Alcohol  use: Never  ? Drug use: Never  ? ? ? ?Allergies   ?Patient has no known allergies. ? ? ?Review of Systems ?Review of Systems  ?Constitutional:  Positive for fever. Negative for activity change, appetite change, chills, diaphoresis, fatigue and unexpected weight change.  ?HENT:  Positive for congestion, rhinorrhea and sore throat. Negative for dental problem, drooling, ear discharge, ear pain, facial swelling, hearing loss, mouth sores, nosebleeds, postnasal drip, sinus pressure, sinus pain, sneezing, tinnitus, trouble swallowing and voice change.   ?Eyes:  Positive for pain, redness and itching. Negative for photophobia, discharge and visual disturbance.  ?Respiratory:  Positive for cough and shortness of breath. Negative for apnea, choking, chest tightness, wheezing and stridor.   ?Cardiovascular: Negative.   ?Gastrointestinal: Negative.   ?Skin: Negative.   ?Neurological: Negative.   ? ? ?Physical Exam ?Triage Vital Signs ?ED Triage Vitals  ?Enc Vitals Group  ?   BP 12/14/21 1401 (!) 139/117  ?   Pulse Rate 12/14/21 1401 81  ?   Resp 12/14/21 1401 (!) 22  ?   Temp 12/14/21 1401 99.6 ?F (37.6 ?C)  ?   Temp Source 12/14/21 1401 Oral  ?   SpO2 12/14/21 1401 97 %  ?   Weight 12/14/21 1359 (!) 342 lb (155.1 kg)  ?   Height 12/14/21 1359 6\' 8"  (2.032 m)  ?   Head Circumference --   ?   Peak Flow --   ?   Pain Score 12/14/21 1356  7  ?   Pain Loc --   ?   Pain Edu? --   ?   Excl. in GC? --   ? ?No data found. ? ?Updated Vital Signs ?BP (!) 142/107 (BP Location: Left Arm)   Pulse 81   Temp 99.6 ?F (37.6 ?C) (Oral)   Resp (!) 22   Ht 6\' 8"  (2.032 m)   Wt (!) 342 lb (155.1 kg)   SpO2 97%   BMI 37.57 kg/m?  ? ?Visual Acuity ?Right Eye Distance: 20/40 Uncorrected ?Left Eye Distance: 20/40 Uncorrected ?Bilateral Distance: 20/40 Uncorrected ? ?Right Eye Near:   ?Left Eye Near:    ?Bilateral Near:    ? ?Physical Exam ?Constitutional:   ?   Appearance: He is well-developed.  ?HENT:  ?   Head: Normocephalic.  ?   Right Ear:  Tympanic membrane and ear canal normal.  ?   Left Ear: Tympanic membrane and ear canal normal.  ?   Nose: Congestion and rhinorrhea present.  ?   Mouth/Throat:  ?   Mouth: Mucous membranes are moist.  ?   Pharynx: Posterior oropharyngeal erythema present.  ?   Tonsils: No tonsillar exudate. 2+ on the right. 2+ on the left.  ?   Comments: Erythema noted to the conjunctive of the right eye without drainage, swelling noted, vision grossly intact, extraocular movements intact ?Cardiovascular:  ?   Rate and Rhythm: Normal rate and regular rhythm.  ?   Heart sounds: Normal heart sounds.  ?Pulmonary:  ?   Effort: Pulmonary effort is normal.  ?   Breath sounds: Normal breath sounds.  ?Abdominal:  ?   General: Bowel sounds are normal.  ?   Palpations: Abdomen is soft.  ?Musculoskeletal:  ?   Cervical back: Normal range of motion.  ?Lymphadenopathy:  ?   Cervical: Cervical adenopathy present.  ?Skin: ?   General: Skin is warm and dry.  ?Neurological:  ?   Mental Status: He is alert.  ?Psychiatric:     ?   Mood and Affect: Mood normal.     ?   Behavior: Behavior normal.  ? ? ? ?UC Treatments / Results  ?Labs ?(all labs ordered are listed, but only abnormal results are displayed) ?Labs Reviewed  ?GROUP A STREP BY PCR  ? ? ?EKG ? ? ?Radiology ?No results found. ? ?Procedures ?Procedures (including critical care time) ? ?Medications Ordered in UC ?Medications - No data to display ? ?Initial Impression / Assessment and Plan / UC Course  ?I have reviewed the triage vital signs and the nursing notes. ? ?Pertinent labs & imaging results that were available during my care of the patient were reviewed by me and considered in my medical decision making (see chart for details). ? ?Viral URI ?Bacterial conjunctivitis of right ? ?Vital signs are stable, patient is in no signs of distress, O2 saturation 97% on room air and lungs are clear to auscultation, presentation on the right is consistent with conjunctivitis, discussed with patient,  strep PCR negative, discussed with patient, etiology is symptoms are most likely viral, prescribed viscous lidocaine and ibuprofen 800 mg for management of sore throat as this is the most worrisome symptom today, Polytrim prescribed for management of conjunctivitis, discussed administration, may continue over-the-counter medications for supportive care, may follow-up with urgent care as needed ?Final Clinical Impressions(s) / UC Diagnoses  ? ?Final diagnoses:  ?Sorethroat  ? ?Discharge Instructions   ?None ?  ? ?ED Prescriptions   ?None ?  ? ?  PDMP not reviewed this encounter. ?  ?Valinda HoarWhite, Dessa Ledee R, NP ?12/14/21 1501 ? ?

## 2022-01-28 DIAGNOSIS — Z419 Encounter for procedure for purposes other than remedying health state, unspecified: Secondary | ICD-10-CM | POA: Diagnosis not present

## 2022-02-28 DIAGNOSIS — Z419 Encounter for procedure for purposes other than remedying health state, unspecified: Secondary | ICD-10-CM | POA: Diagnosis not present

## 2022-03-31 DIAGNOSIS — Z419 Encounter for procedure for purposes other than remedying health state, unspecified: Secondary | ICD-10-CM | POA: Diagnosis not present

## 2022-04-30 DIAGNOSIS — Z419 Encounter for procedure for purposes other than remedying health state, unspecified: Secondary | ICD-10-CM | POA: Diagnosis not present

## 2022-05-31 DIAGNOSIS — Z419 Encounter for procedure for purposes other than remedying health state, unspecified: Secondary | ICD-10-CM | POA: Diagnosis not present

## 2022-06-30 DIAGNOSIS — Z419 Encounter for procedure for purposes other than remedying health state, unspecified: Secondary | ICD-10-CM | POA: Diagnosis not present

## 2022-07-31 DIAGNOSIS — Z419 Encounter for procedure for purposes other than remedying health state, unspecified: Secondary | ICD-10-CM | POA: Diagnosis not present

## 2022-08-31 DIAGNOSIS — Z419 Encounter for procedure for purposes other than remedying health state, unspecified: Secondary | ICD-10-CM | POA: Diagnosis not present

## 2022-09-29 DIAGNOSIS — Z419 Encounter for procedure for purposes other than remedying health state, unspecified: Secondary | ICD-10-CM | POA: Diagnosis not present

## 2022-10-02 ENCOUNTER — Ambulatory Visit (INDEPENDENT_AMBULATORY_CARE_PROVIDER_SITE_OTHER): Payer: Medicaid Other | Admitting: Family Medicine

## 2022-10-02 ENCOUNTER — Encounter: Payer: Self-pay | Admitting: Family Medicine

## 2022-10-02 VITALS — BP 130/80 | HR 87 | Ht 79.0 in | Wt 363.8 lb

## 2022-10-02 DIAGNOSIS — Z0000 Encounter for general adult medical examination without abnormal findings: Secondary | ICD-10-CM | POA: Diagnosis not present

## 2022-10-02 DIAGNOSIS — L83 Acanthosis nigricans: Secondary | ICD-10-CM | POA: Diagnosis not present

## 2022-10-02 HISTORY — DX: Encounter for general adult medical examination without abnormal findings: Z00.00

## 2022-10-02 NOTE — Assessment & Plan Note (Addendum)
Chronic condition, denies any degree of progression that he is aware of, involvement around the neck and axillary regions, denies any additional involvement.  No irritation/symptomatology otherwise.    Examination with hyperpigmentation about the lateral neck folds bilaterally, axillary region, some raised areas without overt fluctuance, no skin breakdown, nontender, no lymphadenopathy.  Plan for risk stratification labs including fasting blood sugar at his return for annual physical.  Patient oriented information provided today.

## 2022-10-02 NOTE — Progress Notes (Signed)
     Primary Care / Sports Medicine Office Visit  Patient Information:  Patient ID: DEONTAYE FARVER, male DOB: [birth year] Age: 22 y.o. MRN: [id number]   KATON MARALDO is a pleasant 22 y.o. male presenting with the following:  Chief Complaint  Patient presents with   Establish Care    Vitals:   10/02/22 1017  BP: 130/80  Pulse: 87  SpO2: 98%   Vitals:   10/02/22 1017  Weight: (!) 363 lb 12.8 oz (165 kg)  Height: '6\' 7"'$  (2.007 m)   Body mass index is 40.98 kg/m.  No results found.   Independent interpretation of notes and tests performed by another provider:   None  Procedures performed:   None  Pertinent History, Exam, Impression, and Recommendations:   Rashone was seen today for establish care.  Acanthosis nigricans Assessment & Plan: Chronic condition, denies any degree of progression that he is aware of, involvement around the neck and axillary regions, denies any additional involvement.  No irritation/symptomatology otherwise.    Examination with hyperpigmentation about the lateral neck folds bilaterally, axillary region, some raised areas without overt fluctuance, no skin breakdown, nontender, no lymphadenopathy.  Plan for risk stratification labs including fasting blood sugar at his return for annual physical.  Patient oriented information provided today.   Encounter for medical examination to establish care Assessment & Plan: Presents for establishment of care, does bring up concerns over neck/axillary rash, chronic, weight, family history of diabetes mellitus type 2.  Examination with positive S1-S2, regular rate and rhythm, no additional heart sounds, clear lung fields throughout without wheezes, rales, rhonchi.  In addition to patient oriented information, health information conveyed, coordination of follow-up for annual risk stratification labs discussed.   I provided a total time of 30 minutes including both face-to-face and  non-face-to-face time on 10/02/2022 inclusive of time utilized for medical chart review, information gathering, care coordination with staff, and documentation completion.    Orders & Medications No orders of the defined types were placed in this encounter.  No orders of the defined types were placed in this encounter.    Return in about 1 week (around 10/09/2022) for CPE 1-2 weeks.     Montel Culver, MD, [facility]   Primary Care Sports Medicine Primary Care and Sports Medicine at [facility]

## 2022-10-02 NOTE — Patient Instructions (Signed)
-   Review information attached - Return for physical

## 2022-10-02 NOTE — Assessment & Plan Note (Addendum)
Presents for establishment of care, does bring up concerns over neck/axillary rash, chronic, weight, family history of diabetes mellitus type 2.  Examination with positive S1-S2, regular rate and rhythm, no additional heart sounds, clear lung fields throughout without wheezes, rales, rhonchi.  In addition to patient oriented information, health information conveyed, coordination of follow-up for annual risk stratification labs discussed.

## 2022-10-23 ENCOUNTER — Encounter: Payer: Medicaid Other | Admitting: Family Medicine

## 2022-10-30 DIAGNOSIS — Z419 Encounter for procedure for purposes other than remedying health state, unspecified: Secondary | ICD-10-CM | POA: Diagnosis not present

## 2022-11-08 ENCOUNTER — Ambulatory Visit (INDEPENDENT_AMBULATORY_CARE_PROVIDER_SITE_OTHER): Payer: Medicaid Other | Admitting: Family Medicine

## 2022-11-08 ENCOUNTER — Encounter: Payer: Self-pay | Admitting: Family Medicine

## 2022-11-08 VITALS — BP 122/70 | HR 78 | Ht 79.0 in | Wt 363.0 lb

## 2022-11-08 DIAGNOSIS — Z1159 Encounter for screening for other viral diseases: Secondary | ICD-10-CM

## 2022-11-08 DIAGNOSIS — L83 Acanthosis nigricans: Secondary | ICD-10-CM | POA: Diagnosis not present

## 2022-11-08 DIAGNOSIS — Z23 Encounter for immunization: Secondary | ICD-10-CM | POA: Diagnosis not present

## 2022-11-08 DIAGNOSIS — Z114 Encounter for screening for human immunodeficiency virus [HIV]: Secondary | ICD-10-CM

## 2022-11-08 DIAGNOSIS — Z13220 Encounter for screening for lipoid disorders: Secondary | ICD-10-CM | POA: Diagnosis not present

## 2022-11-08 DIAGNOSIS — Z0000 Encounter for general adult medical examination without abnormal findings: Secondary | ICD-10-CM | POA: Insufficient documentation

## 2022-11-08 DIAGNOSIS — E559 Vitamin D deficiency, unspecified: Secondary | ICD-10-CM | POA: Diagnosis not present

## 2022-11-08 NOTE — Progress Notes (Signed)
Annual Physical Exam Visit  Patient Information:  Patient ID: Mario Ellis, male DOB: May 30, 2001 Age: 22 y.o. MRN: [id number]   Subjective:   CC: Annual Physical Exam  HPI:  Mario Ellis is here for their annual physical.  I reviewed the past medical history, family history, social history, surgical history, and allergies today and changes were made as necessary.  Please see the problem list section below for additional details.  Past Medical History: Past Medical History:  Diagnosis Date   Encounter for medical examination to establish care 10/02/2022   Past Surgical History: Past Surgical History:  Procedure Laterality Date   CLOSED REDUCTION FINGER WITH PERCUTANEOUS PINNING Left 07/12/2017   Left 5th, Procedure: CLOSED REDUCTION PROXIMAL PHALANX PERCUTANEOUS PINNING-FIFTH LEFT;  Surgeon: Kennedy Bucker, MD;  Location: [facility] ORS;  Service: Orthopedics;  Laterality: Left;   Family History: Family History  Problem Relation Age of Onset   Hypertension Mother    Arthritis Mother    Asthma Mother    Kidney disease Mother    Diabetes Father    Hypertension Paternal Grandmother    Allergies: No Known Allergies Health Maintenance: Health Maintenance  Topic Date Due   COVID-19 Vaccine (1) Never done   HPV VACCINES (1 - Male 2-dose series) Never done   HIV Screening  Never done   Hepatitis C Screening  Never done   DTaP/Tdap/Td (1 - Tdap) Never done   INFLUENZA VACCINE  03/01/2023    [facility] Colonoscopy     This patient has no relevant Health Maintenance data.      Medications: No current outpatient medications on file prior to visit.   No current facility-administered medications on file prior to visit.    Review of Systems: No headache, visual changes, nausea, vomiting, diarrhea, constipation, dizziness, abdominal pain, +skin rash, fevers, chills, night sweats, swollen lymph nodes, weight loss, chest pain, body aches, joint swelling, muscle aches,  shortness of breath, mood changes, visual or auditory hallucinations reported.  Objective:   Vitals:   11/08/22 0822  BP: 122/70  Pulse: 78  SpO2: 97%   Vitals:   11/08/22 0822  Weight: (!) 363 lb (164.7 kg)  Height: 6\' 7"  (2.007 m)   Body mass index is 40.89 kg/m.  General: Well Developed, well nourished, and in no acute distress.  Neuro: Alert and oriented x3, extra-ocular muscles intact, sensation grossly intact. Cranial nerves II through XII are grossly intact, motor, sensory, and coordinative functions are intact. HEENT: Normocephalic, atraumatic, pupils equal round reactive to light, neck supple, no masses, no lymphadenopathy, thyroid nonpalpable. Oropharynx, nasopharynx, external ear canals are unremarkable. Skin: Warm and dry, stable appearance of acanthosis nigricans at neck, axilla, back.  Cardiac: Regular rate and rhythm, no murmurs rubs or gallops. No peripheral edema. Pulses symmetric. Respiratory: Clear to auscultation bilaterally. Not using accessory muscles, speaking in full sentences.  Abdominal: Soft, nontender, nondistended, positive bowel sounds, no masses, no organomegaly. Musculoskeletal: Shoulder, elbow, wrist, hip, knee, ankle stable, and with full range of motion.   Impression and Recommendations:   The patient was counselled, risk factors were discussed, and anticipatory guidance given.  Problem List Items Addressed This Visit       Musculoskeletal and Integument   Acanthosis nigricans    Stable issue without interval change, risk stratification labs ordered.        Other   Healthcare maintenance - Primary    Annual examination completed, risk stratification labs ordered, anticipatory guidance provided.  We will follow  labs once resulted.      Relevant Orders   CBC   Comprehensive metabolic panel   Hepatitis C antibody   HIV Antibody (routine testing w rflx)   Lipid panel   TSH   VITAMIN D 25 Hydroxy (Vit-D Deficiency, Fractures)   Tdap  vaccine greater than or equal to 7yo IM   Need for diphtheria-tetanus-pertussis (Tdap) vaccine    Administered today      Other Visit Diagnoses     Screening for HIV (human immunodeficiency virus)       Relevant Orders   HIV Antibody (routine testing w rflx)   Need for hepatitis C screening test       Relevant Orders   Hepatitis C antibody   Screening for lipoid disorders       Relevant Orders   Comprehensive metabolic panel   Lipid panel   Annual physical exam       Relevant Orders   CBC   Comprehensive metabolic panel   Hepatitis C antibody   HIV Antibody (routine testing w rflx)   Lipid panel   TSH   VITAMIN D 25 Hydroxy (Vit-D Deficiency, Fractures)   Tdap vaccine greater than or equal to 7yo IM   Vitamin D deficiency       Relevant Orders   VITAMIN D 25 Hydroxy (Vit-D Deficiency, Fractures)        Orders & Medications Medications: No orders of the defined types were placed in this encounter.  Orders Placed This Encounter  Procedures   Tdap vaccine greater than or equal to 7yo IM   CBC   Comprehensive metabolic panel   Hepatitis C antibody   HIV Antibody (routine testing w rflx)   Lipid panel   TSH   VITAMIN D 25 Hydroxy (Vit-D Deficiency, Fractures)     Return in about 1 year (around 11/08/2023) for CPE.    Jerrol Banana, MD, [facility]   Primary Care Sports Medicine Primary Care and Sports Medicine at [facility]

## 2022-11-08 NOTE — Assessment & Plan Note (Signed)
Administered today.

## 2022-11-08 NOTE — Assessment & Plan Note (Signed)
Stable issue without interval change, risk stratification labs ordered.

## 2022-11-08 NOTE — Assessment & Plan Note (Signed)
Annual examination completed, risk stratification labs ordered, anticipatory guidance provided.  We will follow labs once resulted. 

## 2022-11-08 NOTE — Patient Instructions (Signed)
-   Obtain fasting labs with orders provided (can have water or black coffee but otherwise no food or drink x 8 hours before labs) °- Review information provided °- Attend eye doctor annually, dentist every 6 months, work towards or maintain 30 minutes of moderate intensity physical activity at least 5 days per week, and consume a balanced diet °- Return in 1 year for physical °- Contact us for any questions between now and then °

## 2022-11-09 LAB — CBC
Hematocrit: 41.2 % (ref 37.5–51.0)
Hemoglobin: 13.4 g/dL (ref 13.0–17.7)
MCH: 27.7 pg (ref 26.6–33.0)
MCHC: 32.5 g/dL (ref 31.5–35.7)
MCV: 85 fL (ref 79–97)
Platelets: 257 10*3/uL (ref 150–450)
RBC: 4.83 x10E6/uL (ref 4.14–5.80)
RDW: 11.3 % — ABNORMAL LOW (ref 11.6–15.4)
WBC: 6.3 10*3/uL (ref 3.4–10.8)

## 2022-11-09 LAB — COMPREHENSIVE METABOLIC PANEL
ALT: 30 IU/L (ref 0–44)
AST: 25 IU/L (ref 0–40)
Albumin/Globulin Ratio: 2 (ref 1.2–2.2)
Albumin: 4.4 g/dL (ref 4.3–5.2)
Alkaline Phosphatase: 64 IU/L (ref 44–121)
BUN/Creatinine Ratio: 8 — ABNORMAL LOW (ref 9–20)
BUN: 7 mg/dL (ref 6–20)
Bilirubin Total: 1.4 mg/dL — ABNORMAL HIGH (ref 0.0–1.2)
CO2: 23 mmol/L (ref 20–29)
Calcium: 9.3 mg/dL (ref 8.7–10.2)
Chloride: 105 mmol/L (ref 96–106)
Creatinine, Ser: 0.9 mg/dL (ref 0.76–1.27)
Globulin, Total: 2.2 g/dL (ref 1.5–4.5)
Glucose: 96 mg/dL (ref 70–99)
Potassium: 4.3 mmol/L (ref 3.5–5.2)
Sodium: 142 mmol/L (ref 134–144)
Total Protein: 6.6 g/dL (ref 6.0–8.5)
eGFR: 123 mL/min/{1.73_m2} (ref >59)

## 2022-11-09 LAB — TSH: TSH: 2.63 u[IU]/mL (ref 0.450–4.500)

## 2022-11-09 LAB — HIV ANTIBODY (ROUTINE TESTING W REFLEX): HIV Screen 4th Generation wRfx: NONREACTIVE

## 2022-11-09 LAB — LIPID PANEL
Chol/HDL Ratio: 5.8 ratio — ABNORMAL HIGH (ref 0.0–5.0)
Cholesterol, Total: 145 mg/dL (ref 100–199)
HDL: 25 mg/dL — ABNORMAL LOW (ref >39)
LDL Chol Calc (NIH): 97 mg/dL (ref 0–99)
Triglycerides: 123 mg/dL (ref 0–149)
VLDL Cholesterol Cal: 23 mg/dL (ref 5–40)

## 2022-11-09 LAB — HEPATITIS C ANTIBODY: Hep C Virus Ab: NONREACTIVE

## 2022-11-09 LAB — VITAMIN D 25 HYDROXY (VIT D DEFICIENCY, FRACTURES): Vit D, 25-Hydroxy: 10 ng/mL — ABNORMAL LOW (ref 30.0–100.0)

## 2022-11-20 ENCOUNTER — Other Ambulatory Visit: Payer: Self-pay | Admitting: Family Medicine

## 2022-11-20 MED ORDER — VITAMIN D (ERGOCALCIFEROL) 1.25 MG (50000 UNIT) PO CAPS: 50000.0000 [IU] | ORAL_CAPSULE | ORAL | 0 refills | Status: AC

## 2022-11-29 DIAGNOSIS — Z419 Encounter for procedure for purposes other than remedying health state, unspecified: Secondary | ICD-10-CM | POA: Diagnosis not present

## 2022-12-30 DIAGNOSIS — Z419 Encounter for procedure for purposes other than remedying health state, unspecified: Secondary | ICD-10-CM | POA: Diagnosis not present

## 2023-01-29 DIAGNOSIS — Z419 Encounter for procedure for purposes other than remedying health state, unspecified: Secondary | ICD-10-CM | POA: Diagnosis not present

## 2023-06-11 ENCOUNTER — Ambulatory Visit
Admission: EM | Admit: 2023-06-11 | Discharge: 2023-06-11 | Disposition: A | Discharge status: Home / Self Care | Payer: 59 | Attending: Family Medicine | Admitting: Family Medicine

## 2023-06-11 DIAGNOSIS — R6889 Other general symptoms and signs: Secondary | ICD-10-CM | POA: Insufficient documentation

## 2023-06-11 DIAGNOSIS — R509 Fever, unspecified: Secondary | ICD-10-CM | POA: Diagnosis present

## 2023-06-11 LAB — RESP PANEL BY RT-PCR (FLU A&B, COVID) ARPGX2
Influenza A by PCR: NEGATIVE
Influenza B by PCR: NEGATIVE
SARS Coronavirus 2 by RT PCR: NEGATIVE

## 2023-06-11 NOTE — ED Triage Notes (Addendum)
Sx started Thursday. Coughing-sneezing-diarrhea-body aches

## 2023-06-11 NOTE — Discharge Instructions (Addendum)
 We will contact you if your COVID or influenza test is positive.  Please quarantine while you wait for the results.  If your test is negative you may resume normal activities.  If your test is positive, quarantine until you are without a fever for at least 24 hours without fever-lowering (Tylenol/Motrin) medications.    If your were prescribed medication, stop by the pharmacy to pick them up.   You can take Tylenol and/or Ibuprofen as needed for fever reduction and pain relief.    For cough: honey 1/2 to 1 teaspoon (you can dilute the honey in water or another fluid).  You can also use guaifenesin and dextromethorphan for cough. You can use a humidifier for chest congestion and cough.  If you don't have a humidifier, you can sit in the bathroom with the hot shower running.      For sore throat: try warm salt water gargles, Mucinex sore throat cough drops or cepacol lozenges, throat spray, warm tea or water with lemon/honey, popsicles or ice, or OTC cold relief medicine for throat discomfort. You can also purchase chloraseptic spray at the pharmacy or dollar store.   For congestion: take a daily anti-histamine like Zyrtec, Claritin, and a oral decongestant, such as pseudoephedrine.  You can also use Flonase 1-2 sprays in each nostril daily. Afrin is also a good option, if you do not have high blood pressure.    It is important to stay hydrated: drink plenty of fluids (water, gatorade/powerade/pedialyte, juices, or teas) to keep your throat moisturized and help further relieve irritation/discomfort.    Return or go to the Emergency Department if symptoms worsen or do not improve in the next few days

## 2023-06-11 NOTE — ED Provider Notes (Signed)
[facility]    CSN: [id number] Arrival date & time: 06/11/23  1048      History   Chief Complaint Chief Complaint  Patient presents with   Cough   Diarrhea    HPI Mario Ellis is a 22 y.o. male.   HPI  History obtained from the patient. Mario Ellis presents for sneezing, congestion, cough, rhinorrhea and diarrhea that started on Friday.  No small amounts of blood in his mucus.  Took Mucinex, DayQuil and NyQuil with short-term relief.  Had a fever. Tmax 102.3 F.  Had wheezing. No dysuria, hematuria, vomiting or diarrhea.        Past Medical History:  Diagnosis Date   Encounter for medical examination to establish care 10/02/2022    Patient Active Problem List   Diagnosis Date Noted   Healthcare maintenance 11/08/2022   Need for diphtheria-tetanus-pertussis (Tdap) vaccine 11/08/2022   Acanthosis nigricans 10/02/2022   Encounter for medical examination to establish care 10/02/2022    Past Surgical History:  Procedure Laterality Date   CLOSED REDUCTION FINGER WITH PERCUTANEOUS PINNING Left 07/12/2017   Left 5th, Procedure: CLOSED REDUCTION PROXIMAL PHALANX PERCUTANEOUS PINNING-FIFTH LEFT;  Surgeon: Kennedy Bucker, MD;  Location: [facility];  Service: Orthopedics;  Laterality: Left;       Home Medications    Prior to Admission medications   Medication Sig Start Date End Date Taking? Authorizing Provider  Vitamin D, Ergocalciferol, (DRISDOL) 1.25 MG (50000 UNIT) CAPS capsule Take 1 capsule (50,000 Units total) by mouth every 7 (seven) days. Take for 8 total doses(weeks) 11/20/22   Jerrol Banana, MD    Family History Family History  Problem Relation Age of Onset   Hypertension Mother    Arthritis Mother    Asthma Mother    Kidney disease Mother    Diabetes Father    Hypertension Paternal Grandmother     Social History Social History   Tobacco Use   Smoking status: Never    Passive exposure: Never   Smokeless tobacco: Never  Vaping Use    Vaping status: Never Used  Substance Use Topics   Alcohol use: Yes    Comment: occ/social   Drug use: Never     Allergies   Patient has no known allergies.   Review of Systems Review of Systems: negative unless otherwise stated in HPI.      Physical Exam Triage Vital Signs ED Triage Vitals  Encounter Vitals Group     BP 06/11/23 1131 139/68     Systolic BP Percentile --      Diastolic BP Percentile --      Pulse Rate 06/11/23 1131 75     Resp 06/11/23 1131 18     Temp 06/11/23 1131 99.1 F (37.3 C)     Temp Source 06/11/23 1131 Oral     SpO2 06/11/23 1131 97 %     Weight --      Height --      Head Circumference --      Peak Flow --      Pain Score 06/11/23 1130 6     Pain Loc --      Pain Education --      Exclude from Growth Chart --    No data found.  Updated Vital Signs BP 139/68 (BP Location: Left Arm)   Pulse 75   Temp 99.1 F (37.3 C) (Oral)   Resp 18   SpO2 97%   Visual Acuity Right Eye Distance:  Left Eye Distance:   Bilateral Distance:    Right Eye Near:   Left Eye Near:    Bilateral Near:     Physical Exam GEN:     alert, non-toxic appearing male in no distress    HENT:  mucus membranes moist, oropharyngeal without lesions or erythema, no tonsillar hypertrophy or exudates,  clear nasal discharge EYES:   no scleral injection or discharge NECK:  normal ROM, no meningismus   RESP:  no increased work of breathing, clear to auscultation bilaterally CVS:   regular rate and rhythm Skin:   warm and dry, no rash on visible skin    UC Treatments / Results  Labs (all labs ordered are listed, but only abnormal results are displayed) Labs Reviewed  RESP PANEL BY RT-PCR (FLU A&B, COVID) [id number]    EKG   Radiology No results found.  Procedures Procedures (including critical care time)  Medications Ordered in UC Medications - No data to display  Initial Impression / Assessment and Plan / UC Course  I have reviewed the triage vital  signs and the nursing notes.  Pertinent labs & imaging results that were available during my care of the patient were reviewed by me and considered in my medical decision making (see chart for details).       Pt is a 22 y.o. male who presents for 3 days of respiratory symptoms. Mario Ellis is afebrile here. Satting well on room air. Overall pt is non-toxic appearing, well hydrated, without respiratory distress. Pulmonary exam is unremarkable.  COVID and influenza testing obtained and was negative. History consistent with viral respiratory illness. Discussed symptomatic treatment.  Explained lack of efficacy of antibiotics in viral disease.  Typical duration of symptoms discussed.   Return and ED precautions given and voiced understanding. Discussed MDM, treatment plan and plan for follow-up with patient who agrees with plan.     Final Clinical Impressions(s) / UC Diagnoses   Final diagnoses:  Flu-like symptoms  Fever, unspecified     Discharge Instructions      We will contact you if your COVID or influenza test is positive.  Please quarantine while you wait for the results.  If your test is negative you may resume normal activities.  If your test is positive, quarantine until you are without a fever for at least 24 hours without fever-lowering (Tylenol/Motrin) medications.    If your were prescribed medication, stop by the pharmacy to pick them up.   You can take Tylenol and/or Ibuprofen as needed for fever reduction and pain relief.    For cough: honey 1/2 to 1 teaspoon (you can dilute the honey in water or another fluid).  You can also use guaifenesin and dextromethorphan for cough. You can use a humidifier for chest congestion and cough.  If you don't have a humidifier, you can sit in the bathroom with the hot shower running.      For sore throat: try warm salt water gargles, Mucinex sore throat cough drops or cepacol lozenges, throat spray, warm tea or water with lemon/honey,  popsicles or ice, or OTC cold relief medicine for throat discomfort. You can also purchase chloraseptic spray at the pharmacy or dollar store.   For congestion: take a daily anti-histamine like Zyrtec, Claritin, and a oral decongestant, such as pseudoephedrine.  You can also use Flonase 1-2 sprays in each nostril daily. Afrin is also a good option, if you do not have high blood pressure.    It is important to  stay hydrated: drink plenty of fluids (water, gatorade/powerade/pedialyte, juices, or teas) to keep your throat moisturized and help further relieve irritation/discomfort.    Return or go to the Emergency Department if symptoms worsen or do not improve in the next few days      ED Prescriptions   None    PDMP not reviewed this encounter.   Katha Cabal, DO 06/19/23 [phone]

## 2023-11-12 ENCOUNTER — Encounter: Payer: Self-pay | Admitting: Family Medicine

## 2023-11-19 ENCOUNTER — Encounter: Payer: BC Managed Care – PPO | Admitting: Family Medicine

## 2024-03-21 ENCOUNTER — Encounter: Admitting: Family Medicine
